# Patient Record
Sex: Female | Born: 1975 | Race: Black or African American | Hispanic: No | Marital: Married | Smoking: Light tobacco smoker
Health system: Southern US, Community
[De-identification: ages and names within clinical notes are randomized; demographics above are authoritative.]

## PROBLEM LIST (undated history)

## (undated) HISTORY — PX: OTHER SURGICAL HISTORY: SHX169

---

## 2005-08-21 ENCOUNTER — Observation Stay: Payer: Self-pay

## 2005-10-10 ENCOUNTER — Inpatient Hospital Stay: Payer: Self-pay

## 2006-09-12 ENCOUNTER — Emergency Department: Payer: Self-pay | Admitting: Unknown Physician Specialty

## 2006-09-12 ENCOUNTER — Other Ambulatory Visit: Payer: Self-pay

## 2007-08-25 ENCOUNTER — Emergency Department: Payer: Self-pay | Admitting: Emergency Medicine

## 2008-03-03 ENCOUNTER — Emergency Department: Payer: Self-pay | Admitting: Emergency Medicine

## 2008-03-14 ENCOUNTER — Encounter: Payer: Self-pay | Admitting: Maternal & Fetal Medicine

## 2008-03-31 ENCOUNTER — Encounter: Payer: Self-pay | Admitting: Obstetrics & Gynecology

## 2009-04-10 ENCOUNTER — Inpatient Hospital Stay (HOSPITAL_COMMUNITY): Admission: AD | Admit: 2009-04-10 | Discharge: 2009-04-13 | Payer: Self-pay | Admitting: Obstetrics and Gynecology

## 2009-04-10 ENCOUNTER — Encounter (INDEPENDENT_AMBULATORY_CARE_PROVIDER_SITE_OTHER): Payer: Self-pay | Admitting: Obstetrics and Gynecology

## 2010-07-05 ENCOUNTER — Emergency Department: Payer: Self-pay | Admitting: Emergency Medicine

## 2010-12-30 LAB — CBC
HCT: 41.4 % (ref 36.0–46.0)
MCHC: 33.6 g/dL (ref 30.0–36.0)
MCV: 99.4 fL (ref 78.0–100.0)
Platelets: 161 10*3/uL (ref 150–400)
Platelets: 204 10*3/uL (ref 150–400)
RDW: 14.2 % (ref 11.5–15.5)
RDW: 14.5 % (ref 11.5–15.5)

## 2010-12-30 LAB — KLEIHAUER-BETKE STAIN
Fetal Cells %: 0 %
Quantitation Fetal Hemoglobin: 0 mL

## 2011-02-05 NOTE — Discharge Summary (Signed)
NAMEZYIONNA, PESCE               ACCOUNT NO.:  [id number]   MEDICAL RECORD NO.:  [id number]          PATIENT TYPE:  INP   LOCATION:  9311                          FACILITY:  [facility]   PHYSICIAN:  Janine Limbo, M.D.DATE OF BIRTH:  August 26, 1976   DATE OF ADMISSION:  04/10/2009  DATE OF DISCHARGE:  04/13/2009                               DISCHARGE SUMMARY   ATTENDING PHYSICIAN:  Janine Limbo, MD   ADMITTING DIAGNOSES:  1. Intrauterine pregnancy at 33-4/7 weeks.  2. Nonreassuring fetal heart rate with abnormal Dopplers.  3. Acute abruption.   POSTOPERATIVE DIAGNOSES:  1. Intrauterine pregnancy at 33-4/7 weeks.  2. Nonreassuring fetal heart rate with abnormal Dopplers.  3. Acute abruption.   PROCEDURES:  Repeat low transverse cesarean section.   HOSPITAL COURSE:  Ms. Goin is a 35 year old gravida 4, para 1-2-0-1,  who presented to the office at 33-4/7 weeks for a scheduled visit.  She  had an ultrasound that day that showed no appreciable amniotic fluid and  a biophysical profile of 0/8.  She was sent directly to Maternity  Admissions Unit to be prepped for a stat C-section.  While she was on  the way to the hospital, she then began to have some bleeding.  Dr.  Estanislado Pandy took the patient to the operating room within 20-25 minutes after  her arrival to the hospital and findings were an acute abruptio in  process.   FINDINGS:  A viable female by the name of Mia, Apgars were 3, 6, and 6,  cord pH was 6.96.  Infant was taken to NICU.  Mother was taken to  recovery in good condition.  The patient's pregnancy had been remarkable  for;  1. A second trimester loss in 1994 at 22 weeks.  2. A previous cesarean section in 1999 at 40 weeks for nonreassuring      fetal heart rate, which led to the birth of a girl named Karel Jarvis, who      was vaguely born in 1 hour.  3. Spontaneous vaginal delivery at 28 weeks of a boy with severe      oligo, who died shortly after birth.  4. Elevated  BMI.  5. Smoker.  6. Positive sickle cell trait.   After surgery, the patient was taken to recovery in good condition.  By  postop day #1, the patient was doing well.  She was up ad lib.  She had  good pain control.  Infant had by that time been extubated in NICU.  The  patient was planning to bottle-feed.  The patient's hemoglobin on day 1  was 11.7, white blood cell count was 9.8, and platelet count was 161.  Pastoral care did visit the patient in light of the NICU infant.  The  patient continued to do well.  She had had a JP drain that drained a  normal amount during her hospitalization.  She was planning to use Ortho-  Tri-Cyclen for birth control.  She had an occasional elevation of her  blood pressure in 126/90, 125/84.  However, no documented issue of  chronic hypertension, gestational  hypertension, or preeclampsia was  noted.  By postop day #3, the patient was doing well.  She was up ad  lib.  Her incision was clean, dry, and intact.  Her JP drain had drained  a small amount and was removed without difficulty.  The patient had  Steri-Strips and subcuticular sutures noted that were intact.  Lochia  was scant.  Infant was continued to improve in the NICU.  There were  some issues with blood sugar management as well as some hematological  issues with one of the components of CBC.  This was followed closely by  Neonatology.  By April 13, 2009, the patient was deemed to receive full  benefit of her hospital stay and was discharged to home in good  condition.   DISCHARGE MEDICATIONS:  1. Motrin 600 mg p.o. q.6 h. p.r.n. pain.  2. Percocet 5/325 one to two p.o. q.2-4 h. p.r.n. pain.  3. Ortho-Tri-Cyclen 1 p.o. daily to be started in 2 weeks.   DISCHARGE FOLLOWUP:  Occur in 4-6 weeks at [facility] or p.r.n.      Renaldo Reel Emilee Hero, C.N.M.      Janine Limbo, M.D.  Electronically Signed    VLL/MEDQ  D:  04/13/2009  T:  04/13/2009  Job:  [id number]

## 2011-02-05 NOTE — H&P (Signed)
[name], [name]               ACCOUNT NO.:  [id number]   MEDICAL RECORD NO.:  [id number]          PATIENT TYPE:  INP   LOCATION:  9311                          [facility]ILITY[facility]  [facility]   PHYSICIAN:  Crist Fat. Rivard, M.D. DATE OF BIRTH:  1976-06-19   DATE OF ADMISSION:  04/10/2009  DATE OF DISCHARGE:                              HISTORY & PHYSICAL   A 35 year old gravida 4, para 3 (one at 22 weeks, died; one at 39 weeks,  deceased) with last menstrual period August 12, 2008, equals an [facility] of  May 21, 2009; ultrasound, May 25, 2009, presents from the office  after a nonstress test and biophysical profile showed a nonreassuring  tracing and 0 fluid.  She had a history of oligohydramnios with her last  child.  She did present to the MAU unit stating that while on her way to  the unit, she started bleeding.  Prenatal care began on October 10, 2008, at [facility].  She was on 17P and her total weight  gain is 4 pounds.  Her labs, O positive with platelets of 294.  VDRL was  nonreactive, rubella immune, HBsAg negative, G negative, HIV  nonreactive, Pap within normal limits, negative GC and CT.   ADMISSION DIAGNOSIS:  Intrauterine pregnancy at 33 weeks with  nonreassuring fetal heart tracing, biophysical profile was 0 and a  nonstress test that was nonreactive.  She had late decelerations and  bleeding.   PAST OB HISTORY:  In 02-18-1993 at 21/22 weeks, she had a vaginal delivery of  a female infant that died.  In 1998-02-18, spontaneous vaginal delivery of a  viable female infant of 40 weeks.  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  In 2006/02/18, at 28 weeks, she had a  history of oligohydramnios with that pregnancy and the baby died.   PAST MEDICAL HISTORY:  She is allergic to MORPHINE, which causes rash.  Denies diabetes, epilepsy, or heart disease.   PAST SURGICAL HISTORY:  She had a C-section for breech.   FAMILY HISTORY:  Heart disease in her mother  and her maternal uncle.  Her maternal grandfather had COPD.  Her mother lupus, cancer.  Stomach  cancer in her paternal grandfather.  Maternal grandfather had prostate  cancer.  Diabetes, her maternal grandmother and paternal grandmother,  adult onset.  Twins, her nephews are twins.   SOCIAL HISTORY:  She is married to [name].  They are both high  school graduates.  She is a smoker, but she stated she decreased this  pregnancy.  She did have alcohol on New Year's Eve, but she has not  drank alcohol.   VITAL SIGNS:  The patient is alert to time and place.  HEART:  Regular without murmur.  LUNGS:  Clear bilaterally.  ABDOMEN:  Trocar placed.  No contractions noted but palpated mild-to-  moderate.  GENITOURINARY:  Vaginal exam, no active bleeding noted, deferred.  There  was some bleeding on underpants, nurse said it was moderate, bright red.   Fetal monitoring placed no variability, questionable late decelerations  were noted.   ASSESSMENT:  Intrauterine pregnancy at 33 weeks with a nonreassuring  fetal heart rate, nonreassuring biophysical profile but nonstress test  and oligohydramnios.   PLAN:  1. Admit to [facility], Dr. Estanislado Pandy is present.  2. Preparing for C-section, shave and IV, OR notified and preparing to      stand by for C-section to the OR.   The baby was 3 pounds, her name is [name], Apgars 3 at one minute, 6 at  five, and 6 at ten.  She had a repeat C-section.  Cord pH was 6.96 and  placenta was sent to the lab.      Jasmine Awe, CNM      Dois Davenport A. Rivard, M.D.  Electronically Signed    JM/MEDQ  D:  04/10/2009  T:  04/11/2009  Job:  [id number]

## 2011-02-05 NOTE — Op Note (Signed)
NAMESOLACE, MANWARREN               ACCOUNT NO.:  [id number]   MEDICAL RECORD NO.:  [id number]          PATIENT TYPE:  INP   LOCATION:  9311                          [facility]   PHYSICIAN:  Crist Fat. Rivard, M.D. DATE OF BIRTH:  1976/04/14   DATE OF PROCEDURE:  04/10/2009  DATE OF DISCHARGE:                               OPERATIVE REPORT   PREOPERATIVE DIAGNOSES:  Intrauterine pregnancy at 33 weeks and 4 days  with nonreassuring fetal testing with a biophysical profile of 0/10,  amniotic fluid index of 0, and reversed diastolic flow on Doppler study,  as well as acute placental abruptio.   POSTOPERATIVE DIAGNOSIS:  Intrauterine pregnancy at 33 weeks and 4 days  with nonreassuring fetal testing with a biophysical profile of 0/10,  amniotic fluid index of 0, and reversed diastolic flow on Doppler study,  as well as acute placental abruptio.   ANESTHESIA:  Spinal.   ANESTHESIOLOGIST:  Raul Del, MD   PROCEDURE:  Repeat low-transverse cesarean section.   SURGEON:  Crist Fat. Rivard, MD   ASSISTANT:  Jasmine Awe, CNM.   ESTIMATED BLOOD LOSS:  700 mL.   HISTORY OF PRESENT ILLNESS:  This is a 35 year old married African  American female, gravida 4, para 3 who presented to the office today for  routine fetal testing due to previous poor obstetrical history.  Her  ultrasound at 33 weeks and 4 days revealed a biophysical profile of  0/10, absence of any measurable amniotic fluid pocket, and reverse end-  diastolic flow on Doppler study.  She was seen by Nigel Bridgeman in the  office and sent directly to Maternity Admission to undergo an emergent  repeat cesarean section.   Her obstetrical history is remarkable for a second trimester loss in  1994 at 79 weeks of a female infant, a cesarean section in 1999 at 40  weeks for nonreassuring fetal heart rate which led to the birth of a  little girl named Karel Jarvis who is legally blind in one eye.  In 2007, she  had a spontaneous  vaginal delivery at 28 weeks of a little boy with  severe oligohydramnios who died shortly after birth.   This pregnancy was followed with twice weekly monitoring, as well as 17-  hydroxyprogesterone injection weekly to avoid preterm labor.  Her last  testing in the office was on July 15 with an NST that was reactive.  The  patient came in this morning with no complaints, but as soon as she left  the office, she started bleeding bright red blood and upon arrival at  [facility] Admission had a moderate amount of vaginal bleeding.  She was  rapidly prepared for cesarean section, rapidly consented, and tracing of  the fetal heart rate revealed a flat tracing with late deceleration.  Between the time, she was in the room at Maternity Admission at 10:22  and the time of birth of the baby at 10:47, there is 25 minutes  including preparation consent, anesthesia and surgery.   PROCEDURE IN DETAIL:  After brief consent and review of risks, the  patient was taken  to cesarean suite and given spinal anesthesia by Dr.  Tacy Dura without any complication.  She was prepped and draped in a  sterile fashion and a Foley catheter was inserted in her bladder.  After  assessing adequate level of anesthesia, we proceeded with a Pfannenstiel  incision overlooking the previous incision and this was brought down  sharply to the fascia.  The fascia was incised in a low-transverse  fashion.  Linea alba was dissected.  Peritoneum was entered bluntly.  Visceral peritoneum was entered in a low-transverse fashion allowing [place]  to safely retract bladder by developing a bladder flap.  Myometrium was  entered in a low transverse fashion first with knife then extended  bluntly.   We assisted the birth of a female infant in vertex presentation at 10:47  a.m.  Mouth and nose were suctioned.  Baby was delivered.  Cord was  clamped with 2 Kelly clamps and sectioned, and the baby was given to the  neonatologist present in the  room.  At this time, we noted multiple  clots coming out from the uterine cavity compatible with our previous  suspicion of acute placental abruption, 2 mL was drawn from the  umbilical artery to send for cord pH, 10 mL was drawn from the umbilical  vein at its insertion with the placenta due to collapse elsewhere and  the placenta was delivered spontaneously.  It was complete, uterine  revision was negative.  Cord had three vessels.  Placenta and cord were  sent to Pathology.   We then proceeded with closure of the myometrium in 2 layers, first with  a running lock suture of 0 Vicryl, then with a Lembert suture of 0  Vicryl imbricating the first one.  Hemostasis was completed on the  peritoneum with cauterization.  Hemostasis was rechecked and deemed  adequate.   Both paracolic gutters were cleaned.  Both tubes and ovaries assessed  and normal except for fine adhesions on the right tube which were  sharply dissected.  Hemostasis in the pelvis was then rechecked and  deemed adequate.  We profusely irrigated the pelvis with warm saline and  removed our retractors and our sponges.  Under fascia, hemostasis was  completed with cautery and the fascia was closed with 2 running sutures  of 1 Vicryl meeting midline.  The wound was irrigated with warm saline.  Hemostasis was completed with cautery and a #10 JP drain was left in the  incision with a left counterincision and sutured to the skin with 0  silk.  The skin was then closed with a subcuticular suture of 3-0  Monocryl and Steri-Strips.   Instruments and sponge count were complete x2.  Estimated blood loss was  700 mL.  The procedure is well tolerated by the patient who was taken to  recovery room in a well and stable condition.   Little girl named [name] was born at 10:47 a.m., received an Apgar of 3 at  1 minute, 6 at 5 minutes, 6 at 10 minutes and had a cord pH of 6.96, was  taken to the NICU, intubated.      Crist Fat Rivard,  M.D.  Electronically Signed     SAR/MEDQ  D:  04/10/2009  T:  04/11/2009  Job:  [id number]

## 2011-08-04 ENCOUNTER — Emergency Department: Payer: Self-pay | Admitting: Unknown Physician Specialty

## 2011-08-06 ENCOUNTER — Emergency Department: Payer: Self-pay | Admitting: Emergency Medicine

## 2013-07-17 ENCOUNTER — Emergency Department: Payer: Self-pay | Admitting: Emergency Medicine

## 2014-10-28 ENCOUNTER — Emergency Department: Payer: Self-pay | Admitting: Emergency Medicine

## 2014-10-28 LAB — COMPREHENSIVE METABOLIC PANEL
ALBUMIN: 3.6 g/dL (ref 3.4–5.0)
ALK PHOS: 84 U/L (ref 46–116)
Anion Gap: 5 — ABNORMAL LOW (ref 7–16)
BUN: 9 mg/dL (ref 7–18)
Bilirubin,Total: 0.3 mg/dL (ref 0.2–1.0)
CALCIUM: 8.6 mg/dL (ref 8.5–10.1)
CREATININE: 1.08 mg/dL (ref 0.60–1.30)
Chloride: 109 mmol/L — ABNORMAL HIGH (ref 98–107)
Co2: 27 mmol/L (ref 21–32)
EGFR (African American): >60
GLUCOSE: 111 mg/dL — AB (ref 65–99)
Osmolality: 281 (ref 275–301)
POTASSIUM: 4 mmol/L (ref 3.5–5.1)
SGOT(AST): 13 U/L — ABNORMAL LOW (ref 15–37)
SGPT (ALT): 17 U/L (ref 14–63)
SODIUM: 141 mmol/L (ref 136–145)
TOTAL PROTEIN: 8 g/dL (ref 6.4–8.2)

## 2014-10-28 LAB — URINALYSIS, COMPLETE
BILIRUBIN, UR: NEGATIVE
Bacteria: NONE SEEN
GLUCOSE, UR: NEGATIVE mg/dL (ref 0–75)
Ketone: NEGATIVE
LEUKOCYTE ESTERASE: NEGATIVE
Nitrite: NEGATIVE
PH: 6 (ref 4.5–8.0)
Protein: NEGATIVE
Specific Gravity: 1.012 (ref 1.003–1.030)
WBC UR: 2 /HPF (ref 0–5)

## 2014-10-28 LAB — CBC WITH DIFFERENTIAL/PLATELET
BASOS ABS: 0.1 10*3/uL (ref 0.0–0.1)
Basophil %: 0.8 %
EOS PCT: 3.4 %
Eosinophil #: 0.2 10*3/uL (ref 0.0–0.7)
HCT: 39.8 % (ref 35.0–47.0)
HGB: 13.2 g/dL (ref 12.0–16.0)
Lymphocyte #: 2.9 10*3/uL (ref 1.0–3.6)
Lymphocyte %: 45.9 %
MCH: 31.1 pg (ref 26.0–34.0)
MCHC: 33.2 g/dL (ref 32.0–36.0)
MCV: 94 fL (ref 80–100)
Monocyte #: 0.4 x10 3/mm (ref 0.2–0.9)
Monocyte %: 6.6 %
NEUTROS PCT: 43.3 %
Neutrophil #: 2.7 10*3/uL (ref 1.4–6.5)
PLATELETS: 277 10*3/uL (ref 150–440)
RBC: 4.25 10*6/uL (ref 3.80–5.20)
RDW: 12.6 % (ref 11.5–14.5)
WBC: 6.3 10*3/uL (ref 3.6–11.0)

## 2014-10-28 LAB — AMYLASE: AMYLASE: 69 U/L (ref 25–115)

## 2014-10-28 LAB — LIPASE, BLOOD: LIPASE: 138 U/L (ref 73–393)

## 2017-04-14 ENCOUNTER — Ambulatory Visit (INDEPENDENT_AMBULATORY_CARE_PROVIDER_SITE_OTHER): Payer: BLUE CROSS/BLUE SHIELD | Admitting: Family

## 2017-04-14 ENCOUNTER — Encounter: Payer: Self-pay | Admitting: Family

## 2017-04-14 VITALS — BP 136/84 | HR 71 | Temp 98.4°F | Ht 64.0 in | Wt 256.0 lb

## 2017-04-14 DIAGNOSIS — G8929 Other chronic pain: Secondary | ICD-10-CM

## 2017-04-14 DIAGNOSIS — Z800 Family history of malignant neoplasm of digestive organs: Secondary | ICD-10-CM | POA: Diagnosis not present

## 2017-04-14 DIAGNOSIS — Z7689 Persons encountering health services in other specified circumstances: Secondary | ICD-10-CM

## 2017-04-14 DIAGNOSIS — M5440 Lumbago with sciatica, unspecified side: Secondary | ICD-10-CM

## 2017-04-14 LAB — COMPREHENSIVE METABOLIC PANEL
ALK PHOS: 69 U/L (ref 39–117)
ALT: 12 U/L (ref 0–35)
AST: 15 U/L (ref 0–37)
Albumin: 3.9 g/dL (ref 3.5–5.2)
BILIRUBIN TOTAL: 0.3 mg/dL (ref 0.2–1.2)
BUN: 10 mg/dL (ref 6–23)
CALCIUM: 9.2 mg/dL (ref 8.4–10.5)
CO2: 21 mEq/L (ref 19–32)
Chloride: 108 mEq/L (ref 96–112)
Creatinine, Ser: 0.99 mg/dL (ref 0.40–1.20)
GFR: 79.11 mL/min (ref >60.00)
Glucose, Bld: 88 mg/dL (ref 70–99)
POTASSIUM: 4.1 meq/L (ref 3.5–5.1)
Sodium: 137 mEq/L (ref 135–145)
TOTAL PROTEIN: 7.6 g/dL (ref 6.0–8.3)

## 2017-04-14 LAB — LIPID PANEL
Cholesterol: 132 mg/dL (ref 0–200)
HDL: 37.6 mg/dL — ABNORMAL LOW (ref >39.00)
LDL Cholesterol: 80 mg/dL (ref 0–99)
NonHDL: 94.31
TRIGLYCERIDES: 70 mg/dL (ref 0.0–149.0)
Total CHOL/HDL Ratio: 4
VLDL: 14 mg/dL (ref 0.0–40.0)

## 2017-04-14 LAB — CBC WITH DIFFERENTIAL/PLATELET
Basophils Absolute: 0 10*3/uL (ref 0.0–0.1)
Basophils Relative: 0.2 % (ref 0.0–3.0)
EOS PCT: 1.9 % (ref 0.0–5.0)
Eosinophils Absolute: 0.1 10*3/uL (ref 0.0–0.7)
HEMATOCRIT: 40.7 % (ref 36.0–46.0)
Hemoglobin: 13.5 g/dL (ref 12.0–15.0)
LYMPHS ABS: 2.7 10*3/uL (ref 0.7–4.0)
LYMPHS PCT: 38.5 % (ref 12.0–46.0)
MCHC: 33.1 g/dL (ref 30.0–36.0)
MCV: 93.6 fl (ref 78.0–100.0)
MONOS PCT: 8.2 % (ref 3.0–12.0)
Monocytes Absolute: 0.6 10*3/uL (ref 0.1–1.0)
NEUTROS PCT: 51.2 % (ref 43.0–77.0)
Neutro Abs: 3.6 10*3/uL (ref 1.4–7.7)
Platelets: 283 10*3/uL (ref 150.0–400.0)
RBC: 4.35 Mil/uL (ref 3.87–5.11)
RDW: 12.6 % (ref 11.5–15.5)
WBC: 7 10*3/uL (ref 4.0–10.5)

## 2017-04-14 LAB — TSH: TSH: 3.3 u[IU]/mL (ref 0.35–4.50)

## 2017-04-14 LAB — HIV ANTIBODY (ROUTINE TESTING W REFLEX): HIV: NONREACTIVE

## 2017-04-14 LAB — HEMOGLOBIN A1C: HEMOGLOBIN A1C: 5.2 % (ref 4.6–6.5)

## 2017-04-14 LAB — VITAMIN D 25 HYDROXY (VIT D DEFICIENCY, FRACTURES): VITD: 16.2 ng/mL — AB (ref 30.00–100.00)

## 2017-04-14 MED ORDER — CYCLOBENZAPRINE HCL 5 MG PO TABS: 5.0000 mg | ORAL_TABLET | Freq: Every day | ORAL | 1 refills | Status: DC

## 2017-04-14 NOTE — Assessment & Plan Note (Addendum)
Screening labs including HIV consented for. Patient understands to schedule 3-D mammogram. Patient will return for physical

## 2017-04-14 NOTE — Assessment & Plan Note (Signed)
Acute on chronic. We had a discussion and suspect low back pain is multifactorial, one of which including patient's work, weight, large breasts. At this time, we decided patient  needed something on occasion after a long day for low back pain. She will try muscle relaxant. She will let me know if not better. We also discussed physical therapy, breast reduction consult in the future

## 2017-04-14 NOTE — Progress Notes (Signed)
Subjective:    Patient ID: [name], female    DOB: 24-Nov-1975, 41 y.o.   MRN: [id number]  CC: [name] is a 41 y.o. female who presents today to establish care.    HPI: Low back pain x 4 months, unchanged. Intermittent. Slipped at work.  Tried tyelonol, heat with relief. NO numbness in leg from thigh to left foot.  No urinary incontinence.   Didn't hit head, LOC.   Family h/o colonoscopy ( father, sister)  Pap 07/2016 with [name] - normal per patient.   Walking twice per week. Had taken phentermine years ago, has maintained weight loss. Recently  Gave up sodas.       HISTORY:  No past medical history on file. No past surgical history on file. No family history on file.  Allergies: Morphine No current outpatient prescriptions on file prior to visit.   No current facility-administered medications on file prior to visit.     Social History  Substance Use Topics  . Smoking status: Former Research scientist (life sciences)  . Smokeless tobacco: Never Used  . Alcohol use Yes    Review of Systems  Constitutional: Negative for chills and fever.  Respiratory: Negative for cough.   Cardiovascular: Negative for chest pain and palpitations.  Gastrointestinal: Negative for nausea and vomiting.  Musculoskeletal: Positive for back pain.  Neurological: Positive for numbness.      Objective:    BP 136/84   Pulse 71   Temp 98.4 F (36.9 C) (Oral)   Ht 5\' 4"  (1.626 m)   Wt 256 lb (116.1 kg)   SpO2 100%   BMI 43.94 kg/m  BP Readings from Last 3 Encounters:  04/14/17 136/84   Wt Readings from Last 3 Encounters:  04/14/17 256 lb (116.1 kg)    Physical Exam  Constitutional: She appears well-developed and well-nourished.  Eyes: Conjunctivae are normal.  Cardiovascular: Normal rate, regular rhythm, normal heart sounds and normal pulses.   Pulmonary/Chest: Effort normal and breath sounds normal. She has no wheezes. She has no rhonchi. She has no rales.  Musculoskeletal:     Lumbar back: She exhibits normal range of motion, no tenderness, no bony tenderness, no swelling, no edema, no pain and no spasm.       Back:  Area of pain as described by patient noted on diagram.   Full range of motion with flexion, tension, lateral side bends. No bony tenderness. No pain, numbness, tingling elicited with single leg raise bilaterally.   Neurological: She is alert. She has normal strength. No sensory deficit.  Reflex Scores:      Patellar reflexes are 2+ on the right side and 2+ on the left side. Sensation and strength intact bilateral lower extremities.  Skin: Skin is warm and dry.  Psychiatric: She has a normal mood and affect. Her speech is normal and behavior is normal. Thought content normal.  Vitals reviewed.      Assessment & Plan:   Problem List Items Addressed This Visit      Nervous and Auditory   Chronic left-sided low back pain with sciatica - Primary    Acute on chronic. We had a discussion and suspect low back pain is multifactorial, one of which including patient's work, weight, large breasts. At this time, we decided patient  needed something on occasion after a long day for low back pain. She will try muscle relaxant. She will let me know if not better. We also discussed physical therapy, breast reduction consult  in the future      Relevant Medications   Acetaminophen (TYLENOL 8 HOUR PO)   cyclobenzaprine (FLEXERIL) 5 MG tablet     Other   Family history of colon cancer in father    Ordered.      Relevant Orders   Ambulatory referral to Gastroenterology   Encounter to establish care    Screening labs including HIV consented for. Patient understands to schedule 3-D mammogram. Patient will return for physical      Relevant Orders   MM SCREENING BREAST TOMO BILATERAL   Ambulatory referral to Gastroenterology   CBC with Differential/Platelet   Comprehensive metabolic panel   Hemoglobin A1c   HIV antibody   Lipid panel   TSH   VITAMIN  D 25 Hydroxy (Vit-D Deficiency, Fractures)       I am having Ms. Reber start on cyclobenzaprine. I am also having her maintain her MULTI-VITAMINS and Acetaminophen (TYLENOL 8 HOUR PO).   Meds ordered this encounter  Medications  . Multiple Vitamin (MULTI-VITAMINS) TABS    Sig: Take by mouth.  . Acetaminophen (TYLENOL 8 HOUR PO)    Sig: Take by mouth.  . cyclobenzaprine (FLEXERIL) 5 MG tablet    Sig: Take 1 tablet (5 mg total) by mouth at bedtime. Take one to tablets at bedtime for muscle spasm,  Low back pain.    Dispense:  30 tablet    Refill:  1    Order Specific Question:   Supervising Provider    Answer:   Crecencio Mc [[id number]]    Return precautions given.   Risks, benefits, and alternatives of the medications and treatment plan prescribed today were discussed, and patient expressed understanding.   Education regarding symptom management and diagnosis given to patient on AVS.  Continue to follow with No primary care provider on file. for routine health maintenance.   [name] and I agreed with plan.   Mable Paris, FNP

## 2017-04-14 NOTE — Progress Notes (Signed)
Pre visit review using our clinic review tool, if applicable. No additional management support is needed unless otherwise documented below in the visit note. 

## 2017-04-14 NOTE — Assessment & Plan Note (Signed)
Ordered

## 2017-04-14 NOTE — Patient Instructions (Addendum)
Labs   Trial flexeril for low back pain; start with one tablet ( 5mg ) at bedtime  Do not drive or operate heavy machinery while on muscle relaxant. Please do not drink alcohol. Only take this medication as needed for acute muscle spasm at bedtime. This medication make you feel drowsy so be very careful.  Stop taking if become too drowsy or somnolent as this puts you at risk for falls. Please contact our office with any questions.    placed a referral. Mammogram this year. I asked that you call one the below locations and schedule this when it is convenient for you.   If you have dense breasts, you may ask for 3D mammogram over the traditional 2D mammogram as new evidence suggest 3D is superior. Please note that NOT all insurance companies cover 3D and you may have to pay a higher copay. You may call your insurance company to further clarify your benefits.   Options for [place]  [place], [place]  * [facility] [place] [place] * Note if you ask for 3D mammogram at this location, you must request [place], [place] location*

## 2017-04-15 ENCOUNTER — Telehealth: Payer: Self-pay

## 2017-04-15 ENCOUNTER — Other Ambulatory Visit: Payer: Self-pay

## 2017-04-15 DIAGNOSIS — Z1211 Encounter for screening for malignant neoplasm of colon: Secondary | ICD-10-CM

## 2017-04-15 DIAGNOSIS — Z8371 Family history of colonic polyps: Secondary | ICD-10-CM

## 2017-04-15 DIAGNOSIS — Z800 Family history of malignant neoplasm of digestive organs: Secondary | ICD-10-CM

## 2017-04-15 NOTE — Telephone Encounter (Signed)
Gastroenterology Pre-Procedure Review  Request Date: 05/30/17 Requesting Physician: Dr. Vicente Males  PATIENT REVIEW QUESTIONS: The patient responded to the following health history questions as indicated:    1. Are you having any GI issues? no 2. Do you have a personal history of Polyps? no 3. Do you have a family history of Colon Cancer or Polyps? yes (Father and brother) 4. Diabetes Mellitus? no 5. Joint replacements in the past 12 months?no 6. Major health problems in the past 3 months?no 7. Any artificial heart valves, MVP, or defibrillator?no    MEDICATIONS & ALLERGIES:    Patient reports the following regarding taking any anticoagulation/antiplatelet therapy:   Plavix, Coumadin, Eliquis, Xarelto, Lovenox, Pradaxa, Brilinta, or Effient? no Aspirin? no  Patient confirms/reports the following medications:  Current Outpatient Prescriptions  Medication Sig Dispense Refill  . Acetaminophen (TYLENOL 8 HOUR PO) Take by mouth.    . cyclobenzaprine (FLEXERIL) 5 MG tablet Take 1 tablet (5 mg total) by mouth at bedtime. Take one to tablets at bedtime for muscle spasm,  Low back pain. 30 tablet 1  . Multiple Vitamin (MULTI-VITAMINS) TABS Take by mouth.     No current facility-administered medications for this visit.     Patient confirms/reports the following allergies:  Allergies  Allergen Reactions  . Morphine     No orders of the defined types were placed in this encounter.   AUTHORIZATION INFORMATION Primary Insurance: 1D#: Group #:  Secondary Insurance: 1D#: Group #:  SCHEDULE INFORMATION: Date: 05/30/17 Time: Location:[facility]

## 2017-05-14 ENCOUNTER — Ambulatory Visit
Admission: RE | Admit: 2017-05-14 | Discharge: 2017-05-14 | Disposition: A | Discharge status: Home / Self Care | Payer: BLUE CROSS/BLUE SHIELD | Source: Ambulatory Visit | Attending: Family | Admitting: Family

## 2017-05-14 DIAGNOSIS — Z7689 Persons encountering health services in other specified circumstances: Secondary | ICD-10-CM | POA: Insufficient documentation

## 2017-05-14 DIAGNOSIS — Z1231 Encounter for screening mammogram for malignant neoplasm of breast: Secondary | ICD-10-CM | POA: Insufficient documentation

## 2017-05-16 ENCOUNTER — Inpatient Hospital Stay
Admission: RE | Admit: 2017-05-16 | Discharge: 2017-05-16 | Disposition: A | Discharge status: Home / Self Care | Payer: Self-pay | Source: Ambulatory Visit | Attending: *Deleted | Admitting: *Deleted

## 2017-05-16 ENCOUNTER — Other Ambulatory Visit: Payer: Self-pay | Admitting: *Deleted

## 2017-05-16 DIAGNOSIS — Z9289 Personal history of other medical treatment: Secondary | ICD-10-CM

## 2017-05-23 ENCOUNTER — Telehealth: Payer: Self-pay | Admitting: Family

## 2017-05-23 NOTE — Telephone Encounter (Signed)
PT dropped off a bill she got from her labs. Her insurance stated this code that was used is a un-billable code. Please check code and resubmit. Please call pt .

## 2017-05-27 NOTE — Telephone Encounter (Signed)
Not sure what to do please advise. Will await for your return.

## 2017-05-28 ENCOUNTER — Telehealth: Payer: Self-pay | Admitting: *Deleted

## 2017-05-28 NOTE — Telephone Encounter (Signed)
Pt stated that [facility] on [address] faxed over a requested for her bowel prep script for her colostomy  Pt contact [phone]

## 2017-05-28 NOTE — Telephone Encounter (Signed)
We have yet to receive fax. Will await til tomorrow.

## 2017-05-29 NOTE — Telephone Encounter (Signed)
Pt stated that [facility] stated that this form was faxed over -pt was informed of previous statement  Pt will call [facility] to have them re-fax

## 2017-05-30 ENCOUNTER — Encounter
Admission: RE | Disposition: A | Discharge status: Home / Self Care | Payer: Self-pay | Source: Ambulatory Visit | Attending: Gastroenterology

## 2017-05-30 ENCOUNTER — Encounter: Payer: Self-pay | Admitting: *Deleted

## 2017-05-30 ENCOUNTER — Ambulatory Visit
Admission: RE | Admit: 2017-05-30 | Discharge: 2017-05-30 | Disposition: A | Discharge status: Home / Self Care | Payer: BLUE CROSS/BLUE SHIELD | Source: Ambulatory Visit | Attending: Gastroenterology | Admitting: Gastroenterology

## 2017-05-30 ENCOUNTER — Ambulatory Visit: Payer: BLUE CROSS/BLUE SHIELD | Admitting: Anesthesiology

## 2017-05-30 DIAGNOSIS — D125 Benign neoplasm of sigmoid colon: Secondary | ICD-10-CM | POA: Diagnosis not present

## 2017-05-30 DIAGNOSIS — D123 Benign neoplasm of transverse colon: Secondary | ICD-10-CM | POA: Diagnosis not present

## 2017-05-30 DIAGNOSIS — Z720 Tobacco use: Secondary | ICD-10-CM | POA: Insufficient documentation

## 2017-05-30 DIAGNOSIS — Z79899 Other long term (current) drug therapy: Secondary | ICD-10-CM | POA: Diagnosis not present

## 2017-05-30 DIAGNOSIS — Z800 Family history of malignant neoplasm of digestive organs: Secondary | ICD-10-CM | POA: Diagnosis not present

## 2017-05-30 DIAGNOSIS — Z1211 Encounter for screening for malignant neoplasm of colon: Secondary | ICD-10-CM | POA: Insufficient documentation

## 2017-05-30 DIAGNOSIS — Z8371 Family history of colonic polyps: Secondary | ICD-10-CM

## 2017-05-30 HISTORY — PX: COLONOSCOPY WITH PROPOFOL: SHX5780

## 2017-05-30 LAB — POCT PREGNANCY, URINE: PREG TEST UR: NEGATIVE

## 2017-05-30 SURGERY — COLONOSCOPY WITH PROPOFOL
Anesthesia: General

## 2017-05-30 MED ORDER — MIDAZOLAM HCL 2 MG/2ML IJ SOLN
INTRAMUSCULAR | Status: AC
  Filled 2017-05-30: qty 2

## 2017-05-30 MED ORDER — PROPOFOL 500 MG/50ML IV EMUL
INTRAVENOUS | Status: DC | PRN
  Administered 2017-05-30: 140 ug/kg/min via INTRAVENOUS

## 2017-05-30 MED ORDER — SODIUM CHLORIDE 0.9 % IV SOLN
INTRAVENOUS | Status: DC
  Administered 2017-05-30: 09:00:00 via INTRAVENOUS

## 2017-05-30 MED ORDER — LIDOCAINE HCL (CARDIAC) 20 MG/ML IV SOLN
INTRAVENOUS | Status: DC | PRN
  Administered 2017-05-30: 40 mg via INTRAVENOUS

## 2017-05-30 MED ORDER — MIDAZOLAM HCL 2 MG/2ML IJ SOLN
INTRAMUSCULAR | Status: DC | PRN
  Administered 2017-05-30: 1 mg via INTRAVENOUS

## 2017-05-30 MED ORDER — PROPOFOL 10 MG/ML IV BOLUS
INTRAVENOUS | Status: DC | PRN
  Administered 2017-05-30: 70 mg via INTRAVENOUS

## 2017-05-30 NOTE — Transfer of Care (Signed)
Immediate Anesthesia Transfer of Care Note  Patient: [name]  Procedure(s) Performed: Procedure(s): COLONOSCOPY WITH PROPOFOL (N/A)  Patient Location: PACU  Anesthesia Type:General  Level of Consciousness: awake, alert  and oriented  Airway & Oxygen Therapy: Patient Spontanous Breathing and Patient connected to nasal cannula oxygen  Post-op Assessment: Report given to RN and Post -op Vital signs reviewed and stable  Post vital signs: Reviewed and stable  Last Vitals:  Vitals:   05/30/17 0819 05/30/17 0926  BP: 121/84 (!) 92/46  Pulse: 80 83  Resp: 20 (!) 27  Temp: 36.4 C (!) 35.8 C  SpO2: 100% 100%    Last Pain:  Vitals:   05/30/17 0926  TempSrc: Tympanic         Complications: No apparent anesthesia complications

## 2017-05-30 NOTE — Anesthesia Preprocedure Evaluation (Signed)
Anesthesia Evaluation  Patient identified by MRN, date of birth, ID band Patient awake    Reviewed: Allergy & Precautions, H&P , NPO status , Patient's Chart, lab work & pertinent test results  History of Anesthesia Complications Negative for: history of anesthetic complications  Airway Mallampati: III  TM Distance: >3 FB Neck ROM: full    Dental  (+) Poor Dentition   Pulmonary neg shortness of breath, Current Smoker,           Cardiovascular Exercise Tolerance: Good (-) angina(-) Past MI and (-) DOE negative cardio ROS       Neuro/Psych  Neuromuscular disease negative psych ROS   GI/Hepatic negative GI ROS, Neg liver ROS,   Endo/Other  negative endocrine ROS  Renal/GU negative Renal ROS  negative genitourinary   Musculoskeletal   Abdominal   Peds  Hematology negative hematology ROS (+)   Anesthesia Other Findings Signs and symptoms suggestive of sleep apnea   Past Surgical History: No date: c-sections x 2  BMI    Body Mass Index:  43.93 kg/m      Reproductive/Obstetrics negative OB ROS                             Anesthesia Physical Anesthesia Plan  ASA: III  Anesthesia Plan: General   Post-op Pain Management:    Induction: Intravenous  PONV Risk Score and Plan: Propofol infusion  Airway Management Planned: Natural Airway and Nasal Cannula  Additional Equipment:   Intra-op Plan:   Post-operative Plan:   Informed Consent: I have reviewed the patients History and Physical, chart, labs and discussed the procedure including the risks, benefits and alternatives for the proposed anesthesia with the patient or authorized representative who has indicated his/her understanding and acceptance.   Dental Advisory Given  Plan Discussed with: Anesthesiologist, CRNA and Surgeon  Anesthesia Plan Comments: (Patient consented for risks of anesthesia including but not limited to:   - adverse reactions to medications - risk of intubation if required - damage to teeth, lips or other oral mucosa - sore throat or hoarseness - Damage to heart, brain, lungs or loss of life  Patient voiced understanding.)        Anesthesia Quick Evaluation

## 2017-05-30 NOTE — Op Note (Signed)
[facility] Gastroenterology Patient Name: [name] Procedure Date: 05/30/2017 8:58 AM MRN: [id number] Account #: [id number] Date of Birth: 1975-12-01 Admit Type: Outpatient Age: 41 Room: [facility] ENDO ROOM 3 Gender: Female Note Status: Finalized Procedure:            Colonoscopy Indications:          Colon cancer screening in patient at increased risk:                        Family history of 1st-degree relative with colon polyps Providers:            Jonathon Bellows MD, MD Referring MD:         Yvetta Coder. Arnett (Referring MD) Medicines:            Monitored Anesthesia Care Complications:        No immediate complications. Procedure:            Pre-Anesthesia Assessment:                       - Prior to the procedure, a History and Physical was                        performed, and patient medications, allergies and                        sensitivities were reviewed. The patient's tolerance of                        previous anesthesia was reviewed.                       - The risks and benefits of the procedure and the                        sedation options and risks were discussed with the                        patient. All questions were answered and informed                        consent was obtained.                       - ASA Grade Assessment: II - A patient with mild                        systemic disease.                       After obtaining informed consent, the colonoscope was                        passed under direct vision. Throughout the procedure,                        the patient's blood pressure, pulse, and oxygen                        saturations were monitored continuously. The  Colonoscope was introduced through the anus and                        advanced to the the cecum, identified by the                        appendiceal orifice, IC valve and transillumination.                        The colonoscopy was  performed with ease. The patient                        tolerated the procedure well. The quality of the bowel                        preparation was adequate. Findings:      The perianal and digital rectal examinations were normal.      A 3 mm polyp was found in the hepatic flexure. The polyp was sessile.       The polyp was removed with a cold biopsy forceps. Resection and       retrieval were complete.      A 10 mm polyp was found in the sigmoid colon. The polyp was       semi-pedunculated. The polyp was removed with a hot snare. Resection and       retrieval were complete. To prevent bleeding after the polypectomy, one       hemostatic clip was successfully placed. There was no bleeding at the       end of the procedure.      The exam was otherwise without abnormality on direct and retroflexion       views. Impression:           - One 3 mm polyp at the hepatic flexure, removed with a                        cold biopsy forceps. Resected and retrieved.                       - One 10 mm polyp in the sigmoid colon, removed with a                        hot snare. Resected and retrieved. Clip was placed.                       - The examination was otherwise normal on direct and                        retroflexion views. Recommendation:       - Discharge patient to home (with escort).                       - Resume previous diet.                       - Continue present medications.                       - Await pathology results.                       -  Repeat colonoscopy for surveillance based on                        pathology results. Procedure Code(s):    --- Professional ---                       [phone], Colonoscopy, flexible; with removal of tumor(s),                        polyp(s), or other lesion(s) by snare technique                       45380, 91, Colonoscopy, flexible; with biopsy, single                        or multiple Diagnosis Code(s):    --- Professional ---                        D12.3, Benign neoplasm of transverse colon (hepatic                        flexure or splenic flexure)                       Z83.71, Family history of colonic polyps                       D12.5, Benign neoplasm of sigmoid colon CPT copyright 2016 American Medical Association. All rights reserved. The codes documented in this report are preliminary and upon coder review may  be revised to meet current compliance requirements. Jonathon Bellows, MD Jonathon Bellows MD, MD 05/30/2017 9:27:05 AM This report has been signed electronically. Number of Addenda: 0 Note Initiated On: 05/30/2017 8:58 AM Scope Withdrawal Time: 0 hours 17 minutes 28 seconds  Total Procedure Duration: 0 hours 20 minutes 16 seconds       [facility]

## 2017-05-30 NOTE — Anesthesia Post-op Follow-up Note (Signed)
Anesthesia QCDR form completed.        

## 2017-05-30 NOTE — Anesthesia Postprocedure Evaluation (Signed)
Anesthesia Post Note  Patient: Mickel Crow  Procedure(s) Performed: Procedure(s) (LRB): COLONOSCOPY WITH PROPOFOL (N/A)  Patient location during evaluation: Endoscopy Anesthesia Type: General Level of consciousness: awake and alert Pain management: pain level controlled Vital Signs Assessment: post-procedure vital signs reviewed and stable Respiratory status: spontaneous breathing, nonlabored ventilation, respiratory function stable and patient connected to nasal cannula oxygen Cardiovascular status: blood pressure returned to baseline and stable Postop Assessment: no signs of nausea or vomiting Anesthetic complications: no     Last Vitals:  Vitals:   05/30/17 0945 05/30/17 0955  BP: 122/81 119/71  Pulse: 74 73  Resp: 18 (!) 22  Temp:    SpO2: 100% 100%    Last Pain:  Vitals:   05/30/17 0926  TempSrc: Tympanic                 Precious Haws Piscitello

## 2017-05-30 NOTE — H&P (Signed)
  Tamara Bellows MD [address], [place], [place] [address] Phone: [phone] Fax : [phone]  Primary Care Physician:  Tamara Hawthorne, FNP Primary Gastroenterologist:  Dr. Jonathon Archer   Pre-Procedure History & Physical: HPI:  [name] is a 41 y.o. female is here for an colonoscopy.   History reviewed. No pertinent past medical history.  Past Surgical History:  Procedure Laterality Date  . c-sections x 2      Prior to Admission medications   Medication Sig Start Date End Date Taking? Authorizing Provider  Acetaminophen (TYLENOL 8 HOUR PO) Take by mouth.   Yes [provider]  cyclobenzaprine (FLEXERIL) 5 MG tablet Take 1 tablet (5 mg total) by mouth at bedtime. Take one to tablets at bedtime for muscle spasm,  Low back pain. 04/14/17  Yes Tamara Hawthorne, FNP  Multiple Vitamin (MULTI-VITAMINS) TABS Take by mouth.   Yes [provider]    Allergies as of 04/15/2017 - Review Complete 04/14/2017  Allergen Reaction Noted  . Morphine  01/25/2013    Family History  Problem Relation Age of Onset  . Breast cancer Maternal Aunt   . Breast cancer Paternal Aunt     Social History   Social History  . Marital status: Married    Spouse name: N/A  . Number of children: N/A  . Years of education: N/A   Occupational History  . Not on file.   Social History Main Topics  . Smoking status: Light Tobacco Smoker  . Smokeless tobacco: Never Used  . Alcohol use Yes  . Drug use: No  . Sexual activity: Yes    Partners: Male   Other Topics Concern  . Not on file   Social History Narrative   Works at [employer]    Review of Systems: See HPI, otherwise negative ROS  Physical Exam: BP 121/84   Pulse 80   Temp 97.6 F (36.4 C) (Tympanic)   Resp 20   Ht 5\' 3"  (1.6 m)   Wt 248 lb (112.5 kg)   SpO2 100%   BMI 43.93 kg/m  General:   Alert,  pleasant and cooperative in NAD Head:  Normocephalic and atraumatic. Neck:  Supple; no masses  or thyromegaly. Lungs:  Clear throughout to auscultation.    Heart:  Regular rate and rhythm. Abdomen:  Soft, nontender and nondistended. Normal bowel sounds, without guarding, and without rebound.   Neurologic:  Alert and  oriented x4;  grossly normal neurologically.  Impression/Plan: [name] is here for an colonoscopy to be performed for colonoscopy with a family history of colon polyps   Risks, benefits, limitations, and alternatives regarding  colonoscopy have been reviewed with the patient.  Questions have been answered.  All parties agreeable.   Tamara Bellows, MD  05/30/2017, 8:56 AM

## 2017-06-02 ENCOUNTER — Encounter: Payer: Self-pay | Admitting: Gastroenterology

## 2017-06-02 LAB — SURGICAL PATHOLOGY

## 2017-06-04 NOTE — Telephone Encounter (Signed)
We still have yet to receive a form from [facility].

## 2017-06-06 NOTE — Telephone Encounter (Signed)
Closing note until we receive forms.

## 2017-06-08 ENCOUNTER — Encounter: Payer: Self-pay | Admitting: Gastroenterology

## 2017-06-16 ENCOUNTER — Encounter: Payer: BLUE CROSS/BLUE SHIELD | Admitting: Family

## 2017-06-16 DIAGNOSIS — Z0289 Encounter for other administrative examinations: Secondary | ICD-10-CM

## 2017-10-09 ENCOUNTER — Emergency Department
Admission: EM | Admit: 2017-10-09 | Discharge: 2017-10-09 | Disposition: A | Discharge status: Home / Self Care | Payer: BLUE CROSS/BLUE SHIELD | Attending: Emergency Medicine | Admitting: Emergency Medicine

## 2017-10-09 ENCOUNTER — Encounter: Payer: Self-pay | Admitting: Emergency Medicine

## 2017-10-09 ENCOUNTER — Other Ambulatory Visit: Payer: Self-pay

## 2017-10-09 DIAGNOSIS — F17210 Nicotine dependence, cigarettes, uncomplicated: Secondary | ICD-10-CM | POA: Insufficient documentation

## 2017-10-09 DIAGNOSIS — T7840XA Allergy, unspecified, initial encounter: Secondary | ICD-10-CM | POA: Diagnosis not present

## 2017-10-09 DIAGNOSIS — Z79899 Other long term (current) drug therapy: Secondary | ICD-10-CM | POA: Diagnosis not present

## 2017-10-09 DIAGNOSIS — R220 Localized swelling, mass and lump, head: Secondary | ICD-10-CM | POA: Diagnosis present

## 2017-10-09 MED ORDER — PREDNISONE 20 MG PO TABS: 40.0000 mg | ORAL_TABLET | Freq: Every day | ORAL | 0 refills | Status: DC

## 2017-10-09 MED ORDER — METHYLPREDNISOLONE SODIUM SUCC 125 MG IJ SOLR
125.0000 mg | Freq: Once | INTRAMUSCULAR | Status: AC
  Administered 2017-10-09: 125 mg via INTRAVENOUS
  Filled 2017-10-09: qty 2

## 2017-10-09 MED ORDER — SODIUM CHLORIDE 0.9 % IV BOLUS (SEPSIS)
1000.0000 mL | Freq: Once | INTRAVENOUS | Status: AC
  Administered 2017-10-09: 1000 mL via INTRAVENOUS

## 2017-10-09 MED ORDER — DIPHENHYDRAMINE HCL 50 MG/ML IJ SOLN
50.0000 mg | Freq: Once | INTRAMUSCULAR | Status: AC
  Administered 2017-10-09: 50 mg via INTRAVENOUS
  Filled 2017-10-09: qty 1

## 2017-10-09 NOTE — ED Notes (Signed)
Esign not working, pt verbalized discharge instructions and has no questions at this time

## 2017-10-09 NOTE — ED Triage Notes (Signed)
Pt reports that yesterday she was broke out in hives today she woke up and had swelling on the left side of her face. States that the last week she started drinking slim fast which is new and tuna which is not new. States that she feels like she is having a little trouble breathing.

## 2017-10-09 NOTE — ED Provider Notes (Signed)
[facility] Emergency Department Provider Note  Time seen: 10:05 AM  I have reviewed the triage vital signs and the nursing notes.   HISTORY  Chief Complaint Facial Swelling    HPI [name] is a 42 y.o. female with no significant past medical history who presents to the emergency department for facial swelling and rash.  According to the patient for the past 2-3 days she has noticed intermittent hives and itching to her upper extremities and torso.  Patient has been using Benadryl and states this makes the rash resolve but then it comes back hours later.  This morning she awoke with significant lower lip and left facial swelling.  States yesterday she noticed that her right hand was somewhat swollen.  Patient denies any known allergies besides morphine.  States she has been using Slim fast over the past 1 and half weeks which is new for her but she stopped it 2 days ago.  Denies any prescription medications or blood pressure medications.  Denies any trouble breathing at this point.  Denies any fever.   History reviewed. No pertinent past medical history.  Patient Active Problem List   Diagnosis Date Noted  . Family history of colon cancer in father 04/14/2017  . Encounter to establish care 04/14/2017  . Chronic left-sided low back pain with sciatica 04/14/2017    Past Surgical History:  Procedure Laterality Date  . c-sections x 2    . COLONOSCOPY WITH PROPOFOL N/A 05/30/2017   Procedure: COLONOSCOPY WITH PROPOFOL;  Surgeon: Jonathon Bellows, MD;  Location: [facility] ENDOSCOPY;  Service: Gastroenterology;  Laterality: N/A;    Prior to Admission medications   Medication Sig Start Date End Date Taking? Authorizing Provider  Acetaminophen (TYLENOL 8 HOUR PO) Take by mouth.    [provider]  cyclobenzaprine (FLEXERIL) 5 MG tablet Take 1 tablet (5 mg total) by mouth at bedtime. Take one to tablets at bedtime for muscle spasm,  Low back pain. 04/14/17    Burnard Hawthorne, FNP  Multiple Vitamin (MULTI-VITAMINS) TABS Take by mouth.    [provider]    Allergies  Allergen Reactions  . Morphine     Family History  Problem Relation Age of Onset  . Breast cancer Maternal Aunt   . Breast cancer Paternal Aunt     Social History Social History   Tobacco Use  . Smoking status: Light Tobacco Smoker  . Smokeless tobacco: Never Used  Substance Use Topics  . Alcohol use: Yes  . Drug use: No    Review of Systems Constitutional: Negative for fever. Eyes: Negative for visual complaints ENT: Lower lip swelling Cardiovascular: Negative for chest pain. Respiratory: Negative for shortness of breath. Gastrointestinal: Negative for abdominal pain, vomiting Genitourinary: Negative for urinary compaints Musculoskeletal: Right hand swelling Skin: Intermittent hives over her upper extremities over the past 2 days Neurological: Negative for headache All other ROS negative  ____________________________________________   PHYSICAL EXAM:  VITAL SIGNS: ED Triage Vitals  Enc Vitals Group     BP 10/09/17 0939 129/79     Pulse Rate 10/09/17 0939 91     Resp 10/09/17 0939 20     Temp 10/09/17 0939 (!) 97.5 F (36.4 C)     Temp Source 10/09/17 0939 Oral     SpO2 10/09/17 0939 97 %     Weight 10/09/17 0939 250 lb (113.4 kg)     Height 10/09/17 0939 5\' 3"  (1.6 m)     Head Circumference --  Peak Flow --      Pain Score 10/09/17 0938 6     Pain Loc --      Pain Edu? --      Excl. in [place]? --     Constitutional: Alert and oriented. Well appearing and in no distress. Eyes: Normal exam ENT   Head: Normocephalic and atraumatic.   Nose: No congestion/rhinnorhea.        Mouth/Throat: Mucous membranes are moist.  Moderate edema of the patient's lower lip especially left side of the lower lip.  No intraoral edema.  Normal tongue and oropharynx. Cardiovascular: Normal rate, regular rhythm. No murmur Respiratory: Normal  respiratory effort without tachypnea nor retractions. Breath sounds are clear  Gastrointestinal: Soft and nontender. No distention.   Musculoskeletal: Nontender with normal range of motion in all extremities. Neurologic:  Normal speech and language. No gross focal neurologic deficits Skin:  Skin is warm, dry.  Several small areas of rash, possible hives right upper extremity.  Slight edema to the right hand but also states some edema to the left hand as well. Psychiatric: Mood and affect are normal. Speech and behavior are normal.   ____________________________________________   INITIAL IMPRESSION / ASSESSMENT AND PLAN / ED COURSE  Pertinent labs & imaging results that were available during my care of the patient were reviewed by me and considered in my medical decision making (see chart for details).  Patient presents to the emergency department with symptoms consistent with an allergic reaction versus angioedema.  Differential would include allergic reaction, angioedema, medication reaction.  Patient denies any new medications or blood pressure medications.  Patient has intermittent hives has a few very small hives to the right upper extremity at this time with moderate lower lip edema.  Most consistent with allergic reaction to unknown antigen.  We will dose IV steroids, Benadryl, fluids and closely monitor the patient in the emergency department.  She has clear lung sounds, no intraoral edema or pharyngeal edema.  I discussed with the patient given the severity of her reaction and no known allergies besides morphine it would be prudent to follow-up with an allergist for further testing and she is agreeable to this plan of care.  We will dose steroids and Benadryl and watch closely in the emergency department of the next couple hours.  ----------------------------------------- 12:49 PM on 10/09/2017 -----------------------------------------  Patient appears well, no acute distress.  Hives  have resolved.  Denies any trouble breathing.  Continues have lower lip swelling largely unchanged first possibly slightly diminished.  No intraoral swelling or edema.  Patient feels safe going home I discussed strict return precautions for any trouble breathing, wheeze or swelling in her mouth tongue or throat.  Patient agreeable to this plan of care.  We will discharge with a prednisone course as well as over-the-counter Benadryl every 6 hours as needed.  I discussed return precautions I also discussed follow-up with an allergist.  Patient agreeable to plan.  ____________________________________________   FINAL CLINICAL IMPRESSION(S) / ED DIAGNOSES  Allergic reaction    Harvest Dark, MD 10/09/17 1250

## 2017-10-31 ENCOUNTER — Other Ambulatory Visit: Payer: Self-pay

## 2017-10-31 ENCOUNTER — Ambulatory Visit: Payer: Self-pay

## 2017-10-31 MED ORDER — FEXOFENADINE HCL 180 MG PO TABS: 180.0000 mg | ORAL_TABLET | Freq: Two times a day (BID) | ORAL | 1 refills | Status: AC

## 2017-10-31 MED ORDER — FAMOTIDINE 20 MG PO TABS: 20.0000 mg | ORAL_TABLET | Freq: Two times a day (BID) | ORAL | 1 refills | Status: DC

## 2017-10-31 MED ORDER — FEXOFENADINE HCL 180 MG PO TABS: 180.0000 mg | ORAL_TABLET | Freq: Two times a day (BID) | ORAL | 1 refills | Status: DC

## 2017-10-31 NOTE — Addendum Note (Signed)
Addended by: Crecencio Mc on: 10/31/2017 12:56 PM   Modules accepted: Orders

## 2017-10-31 NOTE — Telephone Encounter (Signed)
Please advise 

## 2017-10-31 NOTE — Telephone Encounter (Signed)
Pt. Seen in ED last month for this. Has an appointment with an allergist in 2 weeks per pt. Instructed to go to ED. Verbalizes understanding.  Reason for Disposition . [1] Severe swelling AND [2] cause unknown  Answer Assessment - Initial Assessment Questions 1. ONSET: "When did the swelling start?" (e.g., minutes, hours, days)     Started last week 2. LOCATION: "What part of the face is swollen?"      Lip - left side 3. SEVERITY: "How swollen is it?"     Moderate 4. ITCHING: "Is there any itching?" If so, ask: "How much?"   (Scale 1-10; mild, moderate or severe)     No 5. PAIN: "Is the swelling painful to touch?" If so, ask: "How painful is it?"   (Scale 1-10; mild, moderate or severe)     No 6. FEVER: "Do you have a fever?" If so, ask: "What is it, how was it measured, and when did it start?"      No 7. CAUSE: "What do you think is causing the face swelling?"     Unsure - had this before. Seen in ED. 8. RECURRENT SYMPTOM: "Have you had face swelling before?" If so, ask: "When was the last time?" "What happened that time?"     Yes. In January. 9. OTHER SYMPTOMS: "Do you have any other symptoms?" (e.g., toothache, leg swelling)     No 10. PREGNANCY: "Is there any chance you are pregnant?" "When was your last menstrual period?"       No  Protocols used: LIP SWELLING-A-AH, FACE [facility]

## 2017-10-31 NOTE — Telephone Encounter (Signed)
Spoke with patient to follow up regarding triage call regarding lip swelling.  No trouble breathing. She has taken 1 Benadryl.  She is still at work will take her 2-3 hours to finish up.  Co-worker are aware of situation of lip swelling urged her to go on to ED .  However she told me she needed to finish up work.  Her co-worker are aware of situation with lip swelling.   They will call 911 immediately for patient if patient has experiencing trouble breathing.

## 2017-10-31 NOTE — Telephone Encounter (Signed)
Patient advised of below and verbalized understanding. If symptoms get worse she will go to ED for evaluation.   Advised scripts sent to pharmacy.

## 2017-10-31 NOTE — Telephone Encounter (Signed)
See triage message 10/31/17 -verbal orders

## 2017-10-31 NOTE — Addendum Note (Signed)
Addended by: Johna Sheriff on: 10/31/2017 02:24 PM   Modules accepted: Orders

## 2017-10-31 NOTE — Telephone Encounter (Signed)
This is her second episode per review of chart.  She should have been seen after ER visit in january and told to take a daily anthistamine Allegra 180 mg  2 TImes daily   Along with famotidine 20 mg every 12 hours to prevent recurrence until she can be seen by allergist  . These are both available otc ,  Bit I will send rx to her pharmacy

## 2017-11-24 ENCOUNTER — Other Ambulatory Visit: Payer: Self-pay

## 2017-11-24 ENCOUNTER — Ambulatory Visit: Payer: Self-pay

## 2017-11-24 ENCOUNTER — Encounter: Payer: Self-pay | Admitting: Emergency Medicine

## 2017-11-24 ENCOUNTER — Emergency Department
Admission: EM | Admit: 2017-11-24 | Discharge: 2017-11-24 | Disposition: A | Discharge status: Home / Self Care | Payer: BLUE CROSS/BLUE SHIELD | Attending: Emergency Medicine | Admitting: Emergency Medicine

## 2017-11-24 DIAGNOSIS — F17200 Nicotine dependence, unspecified, uncomplicated: Secondary | ICD-10-CM | POA: Insufficient documentation

## 2017-11-24 DIAGNOSIS — Z79899 Other long term (current) drug therapy: Secondary | ICD-10-CM | POA: Diagnosis not present

## 2017-11-24 DIAGNOSIS — L239 Allergic contact dermatitis, unspecified cause: Secondary | ICD-10-CM | POA: Insufficient documentation

## 2017-11-24 DIAGNOSIS — R2231 Localized swelling, mass and lump, right upper limb: Secondary | ICD-10-CM | POA: Diagnosis present

## 2017-11-24 MED ORDER — TRIAMCINOLONE ACETONIDE 0.5 % EX OINT: 1.0000 "application " | TOPICAL_OINTMENT | Freq: Two times a day (BID) | CUTANEOUS | 0 refills | Status: DC

## 2017-11-24 MED ORDER — PREDNISONE 20 MG PO TABS: 40.0000 mg | ORAL_TABLET | Freq: Every day | ORAL | 0 refills | Status: DC

## 2017-11-24 NOTE — ED Notes (Signed)
See triage note  States she noticed some swelling to right arm  Swelling noted to [facility] area   States she put some hydrocortisone cream on area and took a benadryl  Area is still swollen but has decreased

## 2017-11-24 NOTE — Discharge Instructions (Signed)
Keep your appointment with the allergy specialist in 2 weeks.  Begin taking prednisone 2 tablets once a day for the next 5 days.  Triamcinolone cream applied to area as needed for itching.

## 2017-11-24 NOTE — ED Triage Notes (Signed)
Swelling to upper right arm/ pain no injury, has taken benadryl and Cortizone cream with decreased symptoms

## 2017-11-24 NOTE — ED Provider Notes (Signed)
[facility] Emergency Department Provider Note  ____________________________________________   First MD Initiated Contact with Patient 11/24/17 [id number]     (approximate)  I have reviewed the triage vital signs and the nursing notes.   HISTORY  Chief Complaint Abscess  HPI [name] is a 42 y.o. female is here with complaint of swelling to her right upper arm that began last evening.  Patient states that last night she took Benadryl and use cortisone cream to the area which decreased her symptoms of itching.  This morning she woke up and is still swollen but improved from last night.  She denies any difficulty breathing or swallowing.  She has had no wheezing.  She is unaware of any allergen exposure however patient has an appointment in 2 weeks for allergy testing.  Patient states that she has had similar episodes in the past which were relieved with cortisone cream.   History reviewed. No pertinent past medical history.  Patient Active Problem List   Diagnosis Date Noted  . Family history of colon cancer in father 04/14/2017  . Encounter to establish care 04/14/2017  . Chronic left-sided low back pain with sciatica 04/14/2017    Past Surgical History:  Procedure Laterality Date  . c-sections x 2    . COLONOSCOPY WITH PROPOFOL N/A 05/30/2017   Procedure: COLONOSCOPY WITH PROPOFOL;  Surgeon: Jonathon Bellows, MD;  Location: [facility] ENDOSCOPY;  Service: Gastroenterology;  Laterality: N/A;    Prior to Admission medications   Medication Sig Start Date End Date Taking? Authorizing Provider  famotidine (PEPCID) 20 MG tablet Take 1 tablet (20 mg total) by mouth 2 (two) times daily. 10/31/17   Crecencio Mc, MD  fexofenadine (ALLEGRA) 180 MG tablet Take 1 tablet (180 mg total) by mouth 2 (two) times daily. 10/31/17   Crecencio Mc, MD  predniSONE (DELTASONE) 20 MG tablet Take 2 tablets (40 mg total) by mouth daily. 11/24/17   Johnn Hai, PA-C  triamcinolone  ointment (KENALOG) 0.5 % Apply 1 application topically 2 (two) times daily. 11/24/17   Johnn Hai, PA-C    Allergies Morphine  Family History  Problem Relation Age of Onset  . Breast cancer Maternal Aunt   . Breast cancer Paternal Aunt     Social History Social History   Tobacco Use  . Smoking status: Light Tobacco Smoker  . Smokeless tobacco: Never Used  Substance Use Topics  . Alcohol use: Yes  . Drug use: No    Review of Systems Constitutional: No fever/chills Cardiovascular: Denies chest pain. Respiratory: Denies shortness of breath. Gastrointestinal: No abdominal pain.  No nausea, no vomiting.   Musculoskeletal: Negative for back pain. Skin: Right upper extremity with localized soft tissue edema and mild erythema surrounding the antecubital fossa area.  No warmth or tenderness is noted.  There is not any pustules or papules noted.  Drainage is present. Neurological: Negative for headaches, focal weakness or numbness. ___________________________________________   PHYSICAL EXAM:  VITAL SIGNS: ED Triage Vitals  Enc Vitals Group     BP 11/24/17 0831 120/70     Pulse Rate 11/24/17 0831 92     Resp 11/24/17 0831 18     Temp 11/24/17 0831 98.2 F (36.8 C)     Temp Source 11/24/17 0831 Oral     SpO2 11/24/17 0831 99 %     Weight 11/24/17 0832 252 lb (114.3 kg)     Height 11/24/17 0832 5\' 3"  (1.6 m)  Head Circumference --      Peak Flow --      Pain Score 11/24/17 0832 2     Pain Loc --      Pain Edu? --      Excl. in [place]? --    Constitutional: Alert and oriented. Well appearing and in no acute distress. Eyes: Conjunctivae are normal.  Head: Atraumatic. Mouth/Throat: Mucous membranes are moist.  Oropharynx non-erythematous.  No edema noted posterior pharynx. Neck: No stridor.   Cardiovascular: Normal rate, regular rhythm. Grossly normal heart sounds.  Good peripheral circulation. Respiratory: Normal respiratory effort.  No retractions. Lungs  CTAB. Musculoskeletal: No lower extremity tenderness nor edema.   Neurologic:  Normal speech and language. No gross focal neurologic deficits are appreciated.  Skin:  Skin is warm, dry and intact.  Psychiatric: Mood and affect are normal. Speech and behavior are normal.  ____________________________________________   LABS (all labs ordered are listed, but only abnormal results are displayed)  Labs Reviewed - No data to display   PROCEDURES  Procedure(s) performed: None  Procedures  Critical Care performed: No  ____________________________________________   INITIAL IMPRESSION / ASSESSMENT AND PLAN / ED COURSE Patient has a picture of her arm from last evening in comparison to today has improved with Benadryl and cortisone cream.  Patient was using triamcinolone and has completely use the tube.  Patient was started on prednisone and given another prescription for triamcinolone ointment to apply to area twice daily.  She is encouraged to keep her appointment with the allergy specialist in 2 weeks.  ____________________________________________   FINAL CLINICAL IMPRESSION(S) / ED DIAGNOSES  Final diagnoses:  Allergic dermatitis     ED Discharge Orders        Ordered    predniSONE (DELTASONE) 20 MG tablet  Daily     11/24/17 0908    triamcinolone ointment (KENALOG) 0.5 %  2 times daily     11/24/17 0908       Note:  This document was prepared using Dragon voice recognition software and may include unintentional dictation errors.    Johnn Hai, PA-C 11/24/17 1330    Schuyler Amor, MD 11/24/17 1447

## 2017-11-24 NOTE — Telephone Encounter (Signed)
Pt. Reports the itching and swelling woke her up durinh the night. Reports she has an appointment with an allergist, but not until the middle of March. Pt. Reports she is outside of the ED now and will go in for evaluation.  Reason for Disposition . [1] Red area or streak AND [2] large (> 2 in. or 5 cm)  Answer Assessment - Initial Assessment Questions 1. ONSET: "When did the pain start?"     During the night 2. LOCATION: "Where is the pain located?"     Middle of arm on inner aspect 3. PAIN: "How bad is the pain?" (Scale 1-10; or mild, moderate, severe)   - MILD (1-3): doesn't interfere with normal activities   - MODERATE (4-7): interferes with normal activities (e.g., work or school) or awakens from sleep   - SEVERE (8-10): excruciating pain, unable to do any normal activities, unable to hold a cup of water     Moderate - woke pt. Up during the night 4. WORK OR EXERCISE: "Has there been any recent work or exercise that involved this part of the body?"     No 5. CAUSE: "What do you think is causing the arm pain?"     Allergic reaction 6. OTHER SYMPTOMS: "Do you have any other symptoms?" (e.g., neck pain, swelling, rash, fever, numbness, weakness)     Some swelling 7. PREGNANCY: "Is there any chance you are pregnant?" "When was your last menstrual period?"     No  Protocols used: ARM PAIN-A-AH

## 2018-05-08 ENCOUNTER — Ambulatory Visit: Payer: BLUE CROSS/BLUE SHIELD | Admitting: Family

## 2018-05-08 ENCOUNTER — Encounter: Payer: Self-pay | Admitting: Family

## 2018-05-08 VITALS — BP 118/87 | HR 78 | Temp 98.0°F | Resp 16 | Wt 265.1 lb

## 2018-05-08 DIAGNOSIS — Z1239 Encounter for other screening for malignant neoplasm of breast: Secondary | ICD-10-CM

## 2018-05-08 DIAGNOSIS — Z1231 Encounter for screening mammogram for malignant neoplasm of breast: Secondary | ICD-10-CM

## 2018-05-08 DIAGNOSIS — L509 Urticaria, unspecified: Secondary | ICD-10-CM

## 2018-05-08 DIAGNOSIS — M7989 Other specified soft tissue disorders: Secondary | ICD-10-CM

## 2018-05-08 MED ORDER — TRIAMCINOLONE ACETONIDE 0.5 % EX OINT
1.0000 | TOPICAL_OINTMENT | Freq: Two times a day (BID) | CUTANEOUS | 0 refills | Status: DC
Start: 2018-05-08 — End: 2018-07-13

## 2018-05-08 MED ORDER — EPINEPHRINE 0.3 MG/0.3ML IJ SOAJ: 0.3000 mg | Freq: Once | INTRAMUSCULAR | 1 refills | Status: AC

## 2018-05-08 MED ORDER — HYDROCHLOROTHIAZIDE 12.5 MG PO CAPS: 12.5000 mg | ORAL_CAPSULE | Freq: Every day | ORAL | 0 refills | Status: DC

## 2018-05-08 NOTE — Patient Instructions (Addendum)
Labs Monday at 4pm  May start HCTZ for leg swelling. Be careful as this medication will lower blood pressure. Let me know if you feel lightheaded or dizzy.   Epi Pen. Please pick up ; I want you to be safe.   Continue low salt  Compression socks. Let me know if swelling gets worse.   We placed a referral for mammogram this year. I asked that you call one the below locations and schedule this when it is convenient for you.   As discussed, I would like you to ask for 3D mammogram over the traditional 2D mammogram as new evidence suggest 3D is superior.   Please note that NOT all insurance companies cover 3D and you may have to pay a higher copay. You may call your insurance company to further clarify your benefits.   Options for [place]  [place], [place]  * [facility] [place], [place] * Note if you ask for 3D mammogram at this location, you must request [place], [place] location*     Hives of unknown cause   Tips on Aggravating factors -    These include:  ?Physical factors -  As an example, heat (hot showers, extreme humidity) is a common trigger for many , and tight clothing or straps can also aggravate symptoms.   ?Anti-inflammatory medications - Nonsteroidal anti-inflammatory drugs (NSAIDs) worsen symptoms   ?Stress - Patients often report more severe symptoms during periods of physical or psychologic stress   ?Variations in dietary habits and alcohol - Although food allergy is a rare cause of, some patients will report that variations in diet, particularly rich meals or spicy foods, will aggravate symptoms. Alcohol also aggravates symptoms in some.  If hives do not improve, please let me know and remember :   Histamine 1 blocker - Allegra ( you are on this),   claritin or zyrtec once daily  Histamine 2 blocker-  Pepcid AC or Zantac twice a day

## 2018-05-08 NOTE — Progress Notes (Signed)
Subjective:    Patient ID: [name], female    DOB: 11-Jan-1976, 42 y.o.   MRN: [id number]  CC: [name] is a 42 y.o. female who presents today for follow up.   HPI: Rash and swelling of arms, legs, for 8 months, waxed and waned. No rash today. No SOB, trouble swallowing.   Saw an allergist couple of months ago,  patient states not allergic to anything. ( Unable to see these notes). Allergist prescribed an epi pen however was too expensive so she didn't purchase at that time.   started on fexofenadine 10/2017 from our office and has noted less episodes of rash.  Rashes also respond to prednsione. Describes localized swelling, it seems to 'move' from one arm to the other.     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 No known food allergies.   No asthma. H/o eczema.   No family h/o autoimmune disease.   She also complains of biltaeral swelling and 'tightness' on top of feet, for the past 2 months, waxes and goes. Worse after sitting for long period time. No numbness, tingling. No rash. Wears compression socks 2x per week with no relief.  Swelling resolves with elevation. No pain in calves with walking. Low salt diet. No sob, cough, cp.  Would like a medication for this.   Sleeps propped up 3 pillows  Out of habit and comfort. Family h/o HF.      ED 09/2017 allergic reaction; had lip swelling, left facial swelling Itching upper back, torso. Symptoms resolved with prednisone and then 3 weeks later, itching, hives, swelling ( from BL arms, to BL legs)  returned.   ED 11/2017 allergic reaction- No lip swelling at that visit. started on prednisone, triamcinolone with temporary resolves.    HISTORY:  No past medical history on file. Past Surgical History:  Procedure Laterality Date  . c-sections x 2    . COLONOSCOPY WITH PROPOFOL N/A 05/30/2017   Procedure: COLONOSCOPY WITH PROPOFOL;  Surgeon: Jonathon Bellows, MD;  Location: [facility] ENDOSCOPY;   Service: Gastroenterology;  Laterality: N/A;   Family History  Problem Relation Age of Onset  . Breast cancer Maternal Aunt   . Breast cancer Paternal Aunt     Allergies: Morphine Current Outpatient Medications on File Prior to Visit  Medication Sig Dispense Refill  . fexofenadine (ALLEGRA) 180 MG tablet Take 1 tablet (180 mg total) by mouth 2 (two) times daily. 60 tablet 1   No current facility-administered medications on file prior to visit.     Social History   Tobacco Use  . Smoking status: Light Tobacco Smoker  . Smokeless tobacco: Never Used  Substance Use Topics  . Alcohol use: Yes  . Drug use: No    Review of Systems  Constitutional: Negative for chills and fever.  Respiratory: Negative for cough and shortness of breath.   Cardiovascular: Positive for leg swelling. Negative for chest pain and palpitations.  Gastrointestinal: Negative for nausea and vomiting.  Musculoskeletal: Negative for arthralgias and joint swelling.  Skin: Positive for rash.      Objective:    BP 118/87 (BP Location: Left Arm, Patient Position: Sitting, Cuff Size: Large)   Pulse 78   Temp 98 F (36.7 C) (Oral)   Resp 16   Wt 265 lb 2 oz (120.3 kg)   SpO2 97%   BMI 46.96 kg/m  BP Readings from Last 3 Encounters:  05/08/18 118/87  11/24/17 120/70  10/09/17 (!) 112/56   Wt Readings from Last 3 Encounters:  05/08/18 265 lb 2 oz (120.3 kg)  11/24/17 252 lb (114.3 kg)  10/09/17 250 lb (113.4 kg)    Physical Exam  Constitutional: She appears well-developed and well-nourished.  Eyes: Conjunctivae are normal.  Cardiovascular: Normal rate, regular rhythm, normal heart sounds and normal pulses.  No LE edema, palpable cords or masses. No erythema or increased warmth. No asymmetry in calf size when compared bilaterally LE hair growth symmetric and present. No discoloration of varicosities noted. LE warm and palpable pedal pulses.   Pulmonary/Chest: Effort normal and breath sounds  normal. She has no wheezes. She has no rhonchi. She has no rales.  Neurological: She is alert.  Skin: Skin is warm and dry.  No rash.   Psychiatric: She has a normal mood and affect. Her speech is normal and behavior is normal. Thought content normal.  Vitals reviewed.      Assessment & Plan:   Problem List Items Addressed This Visit      Musculoskeletal and Integument   Hives    No rash present today.  Discussed with patient prior emergency room visits, presentation and treatment of rashes, no known etiology thus far.  Epi Pen prescribed and I have advised patient to keep one with her at all times.  Discussed with her that she may stay on the Allegra and may add Zantac for complete histamine blockade.  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  She will let me know if hives recur.      Relevant Medications   triamcinolone ointment (KENALOG) 0.5 %     Other   Leg swelling - Primary    No appreciable edema on exam today.  Based on patient preference regarding complaint, she would like a medication she may take as needed for swelling.  Will start very low-dose hydrochlorothiazide with close attention to her blood pressure as I explained she runs on the low side.  She will also return for repeat BMP.  Also advised compression stockings, elevation, low-salt as conservative, first-line measures.  Close vigilance.      Relevant Medications   hydrochlorothiazide (MICROZIDE) 12.5 MG capsule   Other Relevant Orders   DG Chest 2 View   Comprehensive metabolic panel   B Nat Peptide   Screening for breast cancer    Ordered, patient understands schedule.      Relevant Orders   MM 3D SCREEN BREAST BILATERAL       I have discontinued Jeannia M. Martinezgarcia's famotidine and predniSONE. I am also having her start on EPINEPHrine and hydrochlorothiazide. Additionally, I am having her maintain her fexofenadine and  triamcinolone ointment.   Meds ordered this encounter  Medications  . triamcinolone ointment (KENALOG) 0.5 %    Sig: Apply 1 application topically 2 (two) times daily.    Dispense:  30 g    Refill:  0  . EPINEPHrine 0.3 mg/0.3 mL IJ SOAJ injection    Sig: Inject 0.3 mLs (0.3 mg total) into the muscle once for 1 dose.    Dispense:  1 Device    Refill:  1    Order Specific Question:   Supervising Provider    Answer:   Deborra Medina L [[id number]]  . hydrochlorothiazide (MICROZIDE) 12.5 MG capsule    Sig: Take 1 capsule (12.5 mg total) by mouth daily.    Dispense:  90 capsule    Refill:  0    Order Specific Question:   Supervising Provider    Answer:   Crecencio Mc [[id number]]    Return precautions given.   Risks, benefits, and alternatives of the medications and treatment plan prescribed today were discussed, and patient expressed understanding.   Education regarding symptom management and diagnosis given to patient on AVS.  Continue to follow with Burnard Hawthorne, FNP for routine health maintenance.   [name] and I agreed with plan.   Mable Paris, FNP

## 2018-05-10 DIAGNOSIS — M7989 Other specified soft tissue disorders: Secondary | ICD-10-CM | POA: Insufficient documentation

## 2018-05-10 DIAGNOSIS — Z1239 Encounter for other screening for malignant neoplasm of breast: Secondary | ICD-10-CM | POA: Insufficient documentation

## 2018-05-10 DIAGNOSIS — L509 Urticaria, unspecified: Secondary | ICD-10-CM | POA: Insufficient documentation

## 2018-05-10 NOTE — Assessment & Plan Note (Signed)
Ordered, patient understands schedule.

## 2018-05-10 NOTE — Assessment & Plan Note (Addendum)
No rash present today.  Discussed with patient prior emergency room visits, presentation and treatment of rashes, no known etiology thus far.  Epi Pen prescribed and I have advised patient to keep one with her at all times.  Discussed with her that she may stay on the Allegra and may add Zantac for complete histamine blockade.  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  She will let me know if hives recur.

## 2018-05-10 NOTE — Assessment & Plan Note (Signed)
No appreciable edema on exam today.  Based on patient preference regarding complaint, she would like a medication she may take as needed for swelling.  Will start very low-dose hydrochlorothiazide with close attention to her blood pressure as I explained she runs on the low side.  She will also return for repeat BMP.  Also advised compression stockings, elevation, low-salt as conservative, first-line measures.  Close vigilance.

## 2018-05-11 ENCOUNTER — Other Ambulatory Visit (INDEPENDENT_AMBULATORY_CARE_PROVIDER_SITE_OTHER): Payer: BLUE CROSS/BLUE SHIELD

## 2018-05-11 DIAGNOSIS — M7989 Other specified soft tissue disorders: Secondary | ICD-10-CM

## 2018-05-11 NOTE — Addendum Note (Signed)
Addended by: Arby Barrette on: 05/11/2018 04:19 PM   Modules accepted: Orders

## 2018-05-12 ENCOUNTER — Telehealth: Payer: Self-pay | Admitting: Family

## 2018-05-12 LAB — COMPREHENSIVE METABOLIC PANEL
ALT: 8 U/L (ref 0–35)
AST: 11 U/L (ref 0–37)
Albumin: 3.9 g/dL (ref 3.5–5.2)
Alkaline Phosphatase: 76 U/L (ref 39–117)
BUN: 11 mg/dL (ref 6–23)
CALCIUM: 9.3 mg/dL (ref 8.4–10.5)
CHLORIDE: 105 meq/L (ref 96–112)
CO2: 23 meq/L (ref 19–32)
Creatinine, Ser: 1.19 mg/dL (ref 0.40–1.20)
GFR: 63.65 mL/min (ref >60.00)
Glucose, Bld: 150 mg/dL — ABNORMAL HIGH (ref 70–99)
Potassium: 3.7 mEq/L (ref 3.5–5.1)
Sodium: 136 mEq/L (ref 135–145)
Total Bilirubin: 0.4 mg/dL (ref 0.2–1.2)
Total Protein: 7.8 g/dL (ref 6.0–8.3)

## 2018-05-12 LAB — BRAIN NATRIURETIC PEPTIDE: Brain Natriuretic Peptide: 7 pg/mL (ref <100)

## 2018-05-12 NOTE — Telephone Encounter (Signed)
Copied from [place] [id number]. Topic: General - Other >> May 12, 2018  2:32 PM Yvette Rack wrote: Reason for CRM: pt calling back about lab results

## 2018-05-12 NOTE — Telephone Encounter (Signed)
Ok, then a glucose of 150 is fine.  LG

## 2018-05-12 NOTE — Telephone Encounter (Signed)
Called and notified patient of lab results patient states that she was not fasting when she had labs done.

## 2018-05-13 NOTE — Telephone Encounter (Signed)
Patient advised of below  

## 2018-05-15 ENCOUNTER — Ambulatory Visit (INDEPENDENT_AMBULATORY_CARE_PROVIDER_SITE_OTHER): Payer: BLUE CROSS/BLUE SHIELD

## 2018-05-15 DIAGNOSIS — M7989 Other specified soft tissue disorders: Secondary | ICD-10-CM

## 2018-06-19 ENCOUNTER — Encounter: Payer: Self-pay | Admitting: Family

## 2018-07-08 ENCOUNTER — Ambulatory Visit: Payer: BLUE CROSS/BLUE SHIELD | Admitting: Family

## 2018-07-13 ENCOUNTER — Ambulatory Visit: Payer: BLUE CROSS/BLUE SHIELD | Admitting: Family

## 2018-07-13 ENCOUNTER — Encounter: Payer: Self-pay | Admitting: Family

## 2018-07-13 VITALS — BP 110/82 | HR 90 | Temp 98.0°F | Resp 16 | Ht 63.0 in | Wt 260.5 lb

## 2018-07-13 DIAGNOSIS — M7989 Other specified soft tissue disorders: Secondary | ICD-10-CM | POA: Diagnosis not present

## 2018-07-13 DIAGNOSIS — L509 Urticaria, unspecified: Secondary | ICD-10-CM

## 2018-07-13 DIAGNOSIS — Z6841 Body Mass Index (BMI) 40.0 and over, adult: Secondary | ICD-10-CM | POA: Diagnosis not present

## 2018-07-13 DIAGNOSIS — Z23 Encounter for immunization: Secondary | ICD-10-CM

## 2018-07-13 MED ORDER — TRIAMCINOLONE ACETONIDE 0.5 % EX OINT: 1.0000 "application " | TOPICAL_OINTMENT | Freq: Two times a day (BID) | CUTANEOUS | 0 refills | Status: AC

## 2018-07-13 MED ORDER — BUPROPION HCL ER (XL) 150 MG PO TB24: ORAL_TABLET | ORAL | 3 refills | Status: DC

## 2018-07-13 NOTE — Progress Notes (Signed)
Subjective:    Patient ID: [name], female    DOB: 1976/06/03, 42 y.o.   MRN: [id number]  CC: [name] is a 42 y.o. female who presents today for follow up.   HPI: Hives - No recent hives. No sob, lip swelling. Continues to carry  epi pen. Taking allerga QOD with symptom relief. Has not had to start zantac .   Leg swelling improved on hctz QOD.   Uses kenalog prn with vaseline. Would like refill  Frustrated by weight. Drinking slim fast for breakfast. Doesn't have motivation to exercise.   Also notes Trouble falling asleep. Watching tv in bed till 12/12:30. Gets up at [facility].   Has done phentermine in past however gained weight back.   H/o post partum depression and treated at that time. Doesn't recall medication. No si/hi.   No h/o seizure.   No heavy drinking. Will have  1-2 glasses of wine on weekend nights.      HISTORY:  No past medical history on file. Past Surgical History:  Procedure Laterality Date  . c-sections x 2    . COLONOSCOPY WITH PROPOFOL N/A 05/30/2017   Procedure: COLONOSCOPY WITH PROPOFOL;  Surgeon: Jonathon Bellows, MD;  Location: [facility] ENDOSCOPY;  Service: Gastroenterology;  Laterality: N/A;   Family History  Problem Relation Age of Onset  . Breast cancer Maternal Aunt   . Breast cancer Paternal Aunt     Allergies: Morphine Current Outpatient Medications on File Prior to Visit  Medication Sig Dispense Refill  . EPINEPHrine 0.3 mg/0.3 mL IJ SOAJ injection INJECT 0.3 MLS (0.3 MG TOTAL) INTO THE MUSCLE ONCE FOR 1 DOSE.  1  . fexofenadine (ALLEGRA) 180 MG tablet Take 1 tablet (180 mg total) by mouth 2 (two) times daily. 60 tablet 1  . hydrochlorothiazide (MICROZIDE) 12.5 MG capsule Take 1 capsule (12.5 mg total) by mouth daily. 90 capsule 0   No current facility-administered medications on file prior to visit.     Social History   Tobacco Use  . Smoking status: Light Tobacco Smoker  . Smokeless tobacco: Never Used  Substance Use Topics    . Alcohol use: Yes  . Drug use: No    Review of Systems  Constitutional: Negative for chills and fever.  Respiratory: Negative for cough.   Cardiovascular: Negative for chest pain, palpitations and leg swelling.  Gastrointestinal: Negative for nausea and vomiting.  Skin: Negative for rash.  Psychiatric/Behavioral: Positive for sleep disturbance. Negative for suicidal ideas. The patient is not nervous/anxious.       Objective:    BP 110/82 (BP Location: Left Arm, Patient Position: Sitting, Cuff Size: Large)   Pulse 90   Temp 98 F (36.7 C) (Oral)   Resp 16   Ht 5\' 3"  (1.6 m)   Wt 260 lb 8 oz (118.2 kg)   SpO2 99%   BMI 46.15 kg/m  BP Readings from Last 3 Encounters:  07/13/18 110/82  05/08/18 118/87  11/24/17 120/70   Wt Readings from Last 3 Encounters:  07/13/18 260 lb 8 oz (118.2 kg)  05/08/18 265 lb 2 oz (120.3 kg)  11/24/17 252 lb (114.3 kg)    Physical Exam  Constitutional: She appears well-developed and well-nourished.  Eyes: Conjunctivae are normal.  Cardiovascular: Normal rate, regular rhythm, normal heart sounds and normal pulses.  Pulmonary/Chest: Effort normal and breath sounds normal. She has no wheezes. She has no rhonchi. She has no rales.  Neurological: She is alert.  Skin: Skin is  warm and dry.  Psychiatric: She has a normal mood and affect. Her speech is normal and behavior is normal. Thought content normal.  Vitals reviewed.      Assessment & Plan:   Problem List Items Addressed This Visit      Musculoskeletal and Integument   Hives    Symptoms controlled at this time. Continue regimen      Relevant Medications   triamcinolone ointment (KENALOG) 0.5 %     Other   Leg swelling    Symptoms controlled at this time. Continue regimen      Class 3 severe obesity with body mass index (BMI) of 40.0 to 44.9 in adult [facility]) - Primary     trial of Wellbutrin.  Also discussed importance of lifestyle modifications including exercise to help with  weight loss, trouble falling asleep.  Close follow-up.      Relevant Medications   buPROPion (WELLBUTRIN XL) 150 MG 24 hr tablet    Other Visit Diagnoses    Need for immunization against influenza       Relevant Orders   Flu Vaccine QUAD 36+ mos IM (Completed)       I am having [name] start on buPROPion. I am also having her maintain her fexofenadine, hydrochlorothiazide, EPINEPHrine, and triamcinolone ointment.   Meds ordered this encounter  Medications  . triamcinolone ointment (KENALOG) 0.5 %    Sig: Apply 1 application topically 2 (two) times daily.    Dispense:  30 g    Refill:  0    Order Specific Question:   Supervising Provider    Answer:   Deborra Medina L [[id number]]  . buPROPion (WELLBUTRIN XL) 150 MG 24 hr tablet    Sig: Start 150 mg ER PO qam, increase after 3 days to 300 mg qam.    Dispense:  60 tablet    Refill:  3    Order Specific Question:   Supervising Provider    Answer:   Crecencio Mc [[id number]]    Return precautions given.   Risks, benefits, and alternatives of the medications and treatment plan prescribed today were discussed, and patient expressed understanding.   Education regarding symptom management and diagnosis given to patient on AVS.  Continue to follow with Burnard Hawthorne, FNP for routine health maintenance.   [name] and I agreed with plan.   Mable Paris, FNP

## 2018-07-13 NOTE — Patient Instructions (Addendum)
Start wellbutrin as discussed.   Please limit alcohol to no more than one glass of wine per sitting, since on wellbutrin AND this is good for weight loss :)   This is  Dr. Lupita Dawn  example of a  "Low GI"  Diet:  It will allow you to lose 4 to 8  lbs  per month if you follow it carefully.  Your goal with exercise is a minimum of 30 minutes of aerobic exercise 5 days per week (Walking does not count once it becomes easy!)    All of the foods can be found at grocery stores and in bulk at [employer].  The Atkins protein bars and shakes are available in more varieties at [employer], [employer] and [employer].     7 AM Breakfast:  Choose from the following:  Low carbohydrate Protein  Shakes (I recommend the  Premier Protein chocolate shakes,  EAS AdvantEdge "Carb Control" shakes  Or the Atkins shakes all are under 3 net carbs)     a scrambled egg/bacon/cheese burrito made with [employer]'s "carb balance" whole wheat tortilla  (about 10 net carbs )  Regulatory affairs officer (basically a quiche without the pastry crust) that is eaten cold and very convenient way to get your eggs.  8 carbs)  If you make your own protein shakes, avoid bananas and pineapple,  And use low carb greek yogurt or original /unsweetened almond or soy milk    Avoid cereal and bananas, oatmeal and cream of wheat and grits. They are loaded with carbohydrates!   10 AM: high protein snack:  Protein bar by Atkins (the snack size, under 200 cal, usually < 6 net carbs).    A stick of cheese:  Around 1 carb,  100 cal     Dannon Light n Fit [place] Yogurt  (80 cal, 8 carbs)  Other so called "protein bars" and Greek yogurts tend to be loaded with carbohydrates.  Remember, in food advertising, the word "energy" is synonymous for " carbohydrate."  Lunch:   A Sandwich using the bread choices listed, Can use any  Eggs,  lunchmeat, grilled meat or canned tuna), avocado, regular mayo/mustard  and cheese.  A Salad using blue cheese,  ranch,  Goddess or vinagrette,  Avoid taco shells, croutons or "confetti" and no "candied nuts" but regular nuts OK.   No pretzels, nabs  or chips.  Pickles and miniature sweet peppers are a good low carb alternative that provide a "crunch"  The bread is the only source of carbohydrate in a sandwich and  can be decreased by trying some of the attached alternatives to traditional loaf bread   Avoid "Low fat dressings, as well as Organ dressings They are loaded with sugar!   3 PM/ Mid day  Snack:  Consider  1 ounce of  almonds, walnuts, pistachios, pecans, peanuts,  Macadamia nuts or a nut medley.  Avoid "granola and granola bars "  Mixed nuts are ok in moderation as long as there are no raisins,  cranberries or dried fruit.   KIND bars are OK if you get the low glycemic index variety   Try the prosciutto/mozzarella cheese sticks by [employer]  In deli /backery section   High protein      6 PM  Dinner:     Meat/fowl/fish with a green salad, and either broccoli, cauliflower, green beans, spinach, brussel sprouts or  Lima beans. DO NOT BREAD THE PROTEIN!!  There is a low carb pasta by [employer] that is acceptable and tastes great: only 5 digestible carbs/serving.( All grocery stores but [employer] carry it ) Several ready made meals are available low carb:   Try Michel Angelo's chicken piccata or chicken or eggplant parm over low carb pasta.(Lowes and [employer])   Marjory Lies Sanchez's "Carnitas" (pulled pork, no sauce,  0 carbs) or his beef pot roast to make a dinner burrito (at [employer])  Pesto over low carb pasta (bj's sells a good quality pesto in the center refrigerated section of the deli   Try satueeing  Cheral Marker with mushroooms as a good side   [employer] makes a mashed cauliflower that tastes like mashed potatoes  Whole wheat pasta is still full of digestible carbs and  Not as low in glycemic index as Dreamfield's.   Brown rice is still rice,  So skip the rice and noodles if you  eat [place] or [place] (or at least limit to 1/2 cup)  9 PM snack :   Breyer's "low carb" fudgsicle or  ice cream bar (Carb Smart line), or  Weight Watcher's ice cream bar , or another "no sugar added" ice cream;  a serving of fresh berries/cherries with whipped cream   Cheese or DANNON'S LlGHT N FIT GREEK YOGURT  8 ounces of [place] unsweetened almond/cococunut milk    Treat yourself to a parfait made with whipped cream blueberiies, walnuts and vanilla greek yogurt  Avoid bananas, pineapple, grapes  and watermelon on a regular basis because they are high in sugar.  THINK OF THEM AS DESSERT  Remember that snack Substitutions should be less than 10 NET carbs per serving and meals < 20 carbs. Remember to subtract fiber grams to get the "net carbs."  @TULLOBREADPACKAGE @

## 2018-07-15 DIAGNOSIS — Z6841 Body Mass Index (BMI) 40.0 and over, adult: Secondary | ICD-10-CM

## 2018-07-15 NOTE — Assessment & Plan Note (Signed)
trial of Wellbutrin.  Also discussed importance of lifestyle modifications including exercise to help with weight loss, trouble falling asleep.  Close follow-up.

## 2018-07-15 NOTE — Assessment & Plan Note (Signed)
Symptoms controlled at this time. Continue regimen

## 2018-07-22 ENCOUNTER — Ambulatory Visit
Admission: RE | Admit: 2018-07-22 | Discharge: 2018-07-22 | Disposition: A | Discharge status: Home / Self Care | Payer: BLUE CROSS/BLUE SHIELD | Source: Ambulatory Visit | Attending: Family | Admitting: Family

## 2018-07-22 DIAGNOSIS — Z1239 Encounter for other screening for malignant neoplasm of breast: Secondary | ICD-10-CM

## 2018-08-04 ENCOUNTER — Other Ambulatory Visit: Payer: Self-pay | Admitting: Family

## 2018-08-04 DIAGNOSIS — Z6841 Body Mass Index (BMI) 40.0 and over, adult: Principal | ICD-10-CM

## 2018-08-30 ENCOUNTER — Other Ambulatory Visit: Payer: Self-pay | Admitting: Family

## 2018-08-30 DIAGNOSIS — M7989 Other specified soft tissue disorders: Secondary | ICD-10-CM

## 2018-10-14 ENCOUNTER — Ambulatory Visit: Payer: BLUE CROSS/BLUE SHIELD | Admitting: Family

## 2018-11-01 ENCOUNTER — Other Ambulatory Visit: Payer: Self-pay | Admitting: Family

## 2018-11-01 DIAGNOSIS — Z6841 Body Mass Index (BMI) 40.0 and over, adult: Principal | ICD-10-CM

## 2019-01-15 IMAGING — MG MM DIGITAL SCREENING BILAT W/ TOMO W/ CAD
8 of 12 series · 8 of 28 positions shown · non-contrast
Comparison: Previous exam(s).

CLINICAL DATA: Screening.

EXAM:
2D DIGITAL SCREENING BILATERAL MAMMOGRAM WITH CAD AND ADJUNCT TOMO

[L CC]
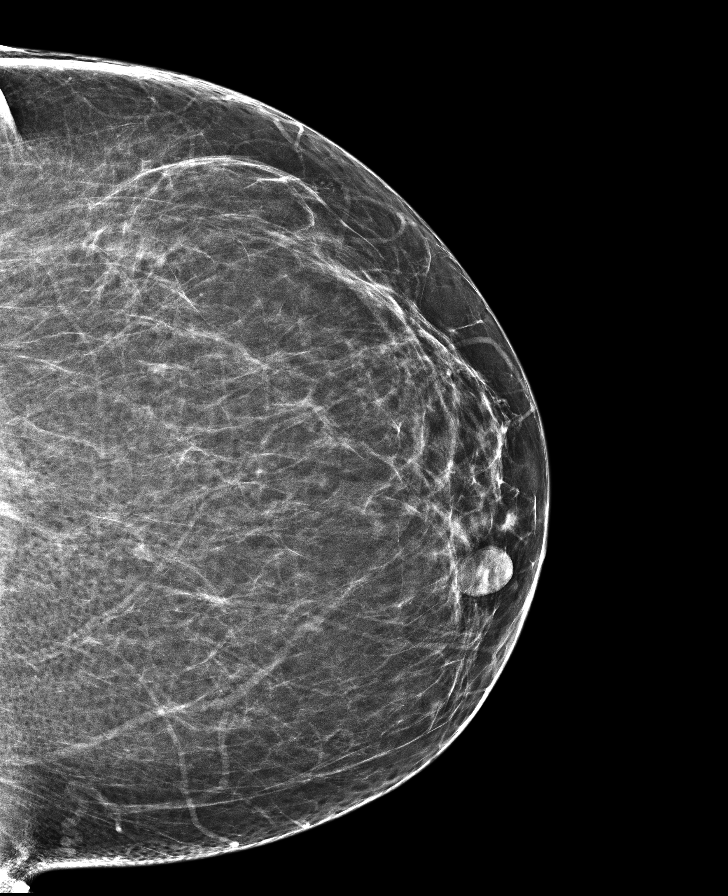

[R CC synth-2D]
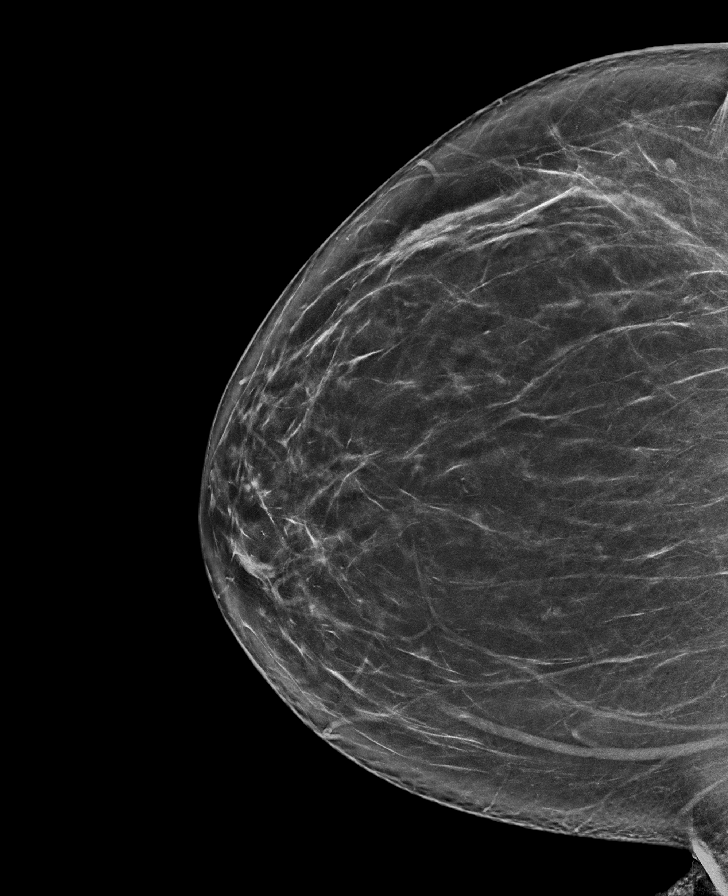

[R MLO synth-2D]
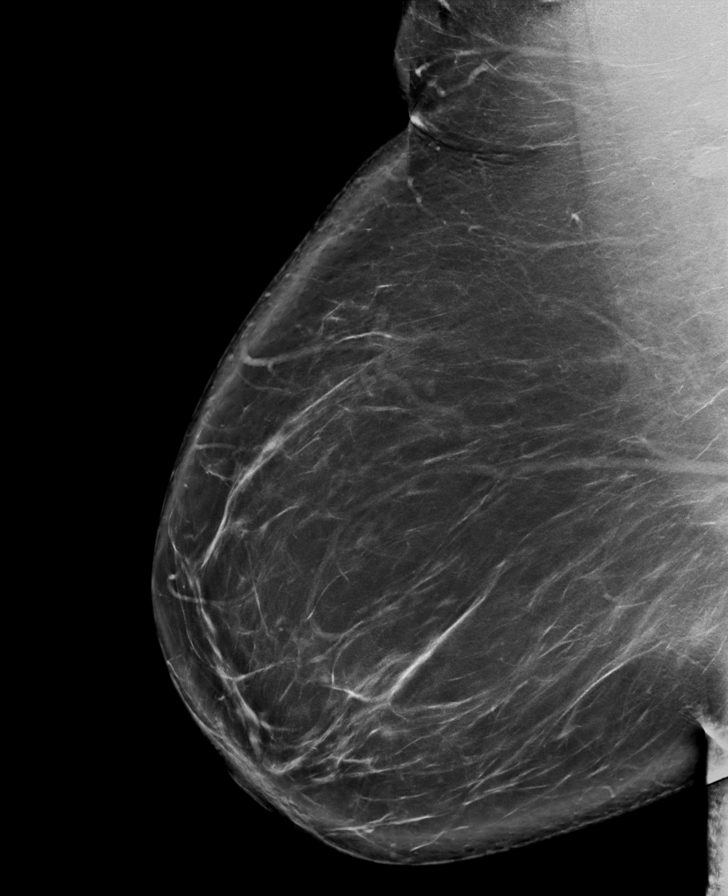

[R CC]
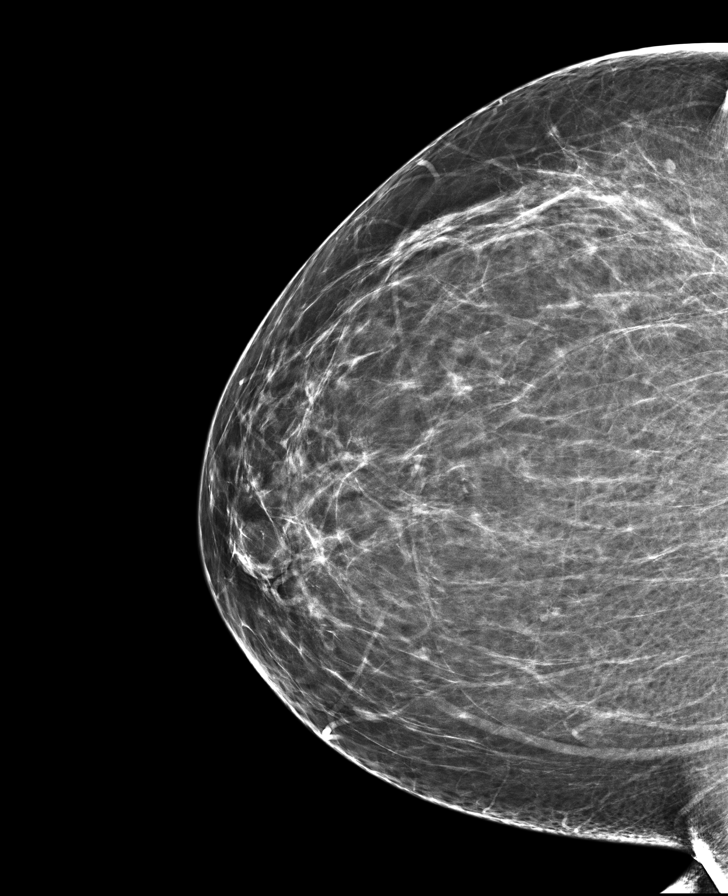

[L MLO synth-2D]
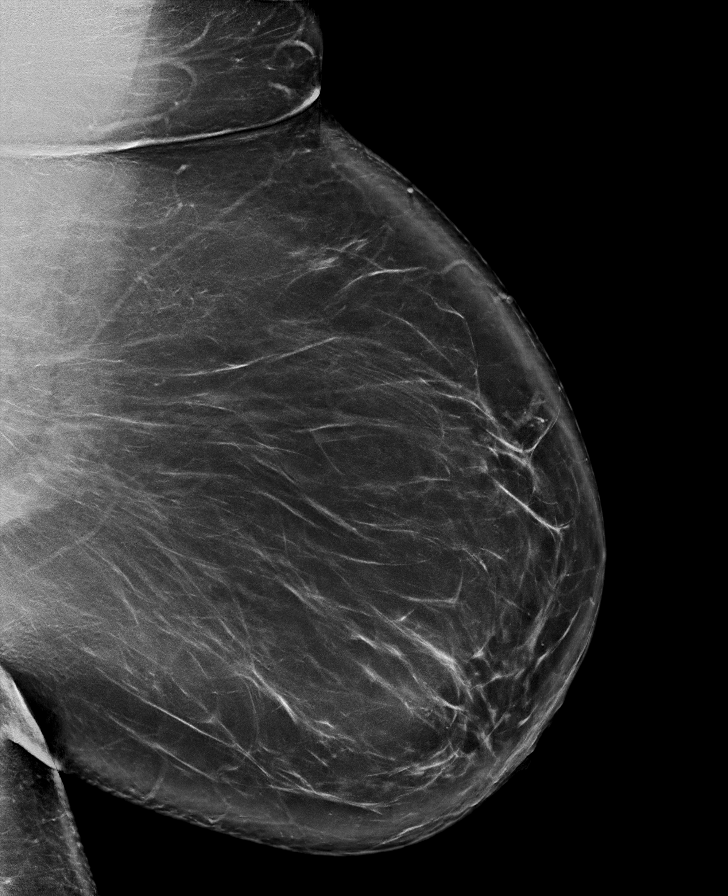

[L MLO]
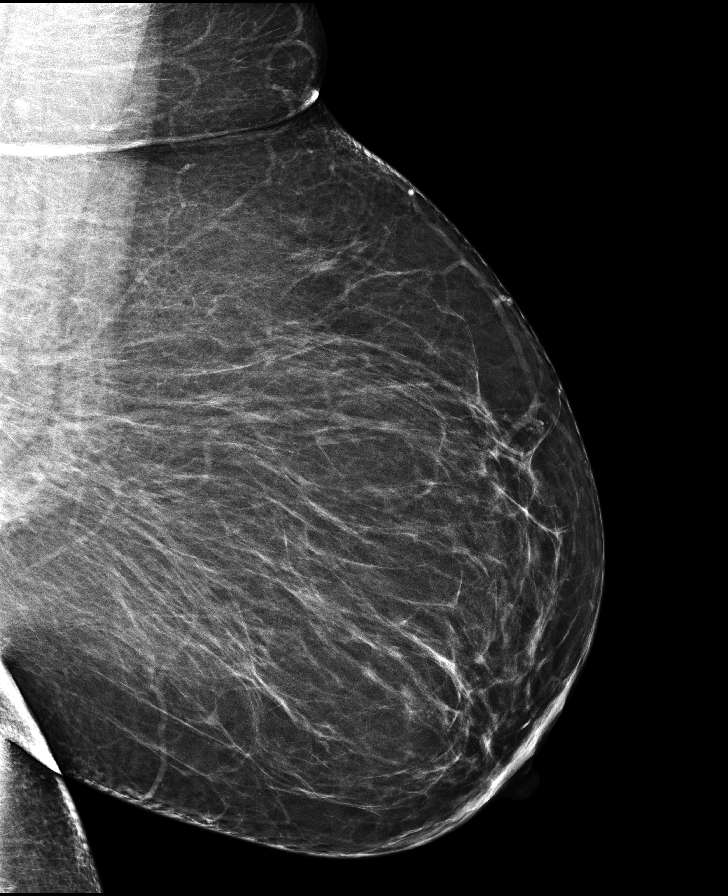

[R MLO]
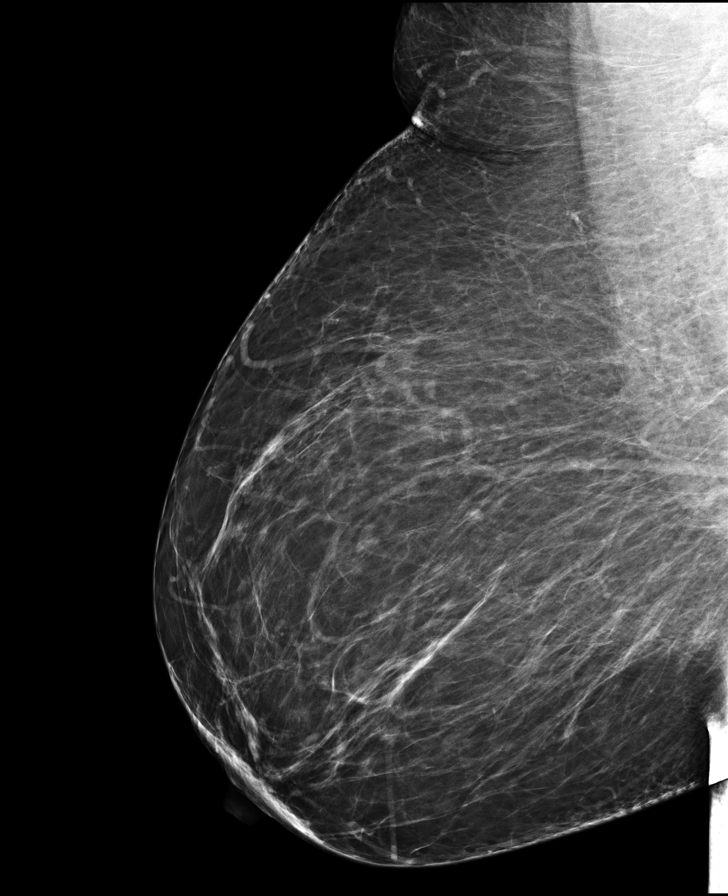

[L CC synth-2D]
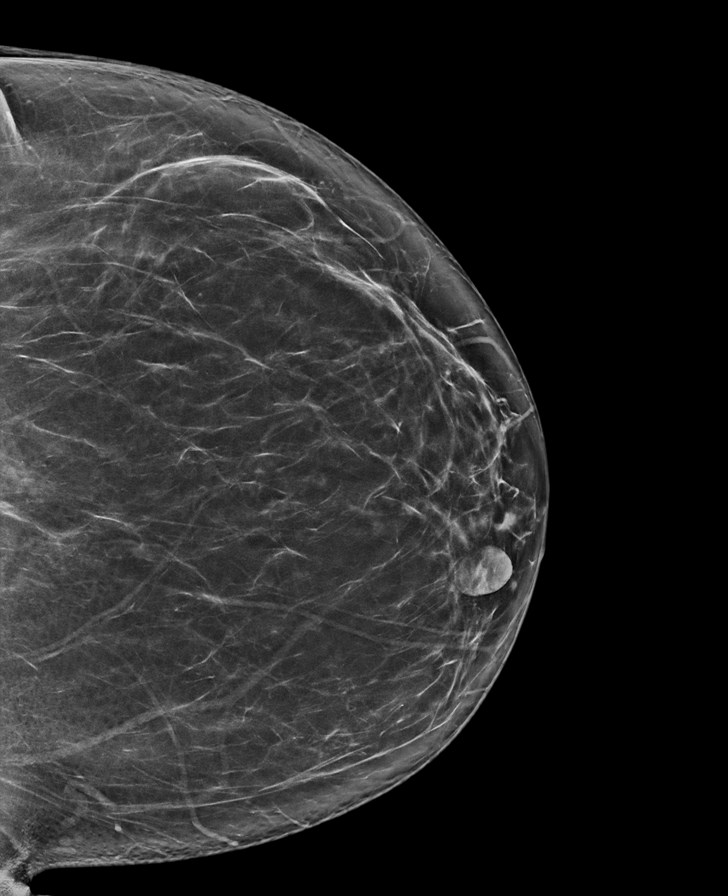

[8 of 28 positions shown; findings below may reference images not displayed]

ACR Breast Density Category b: There are scattered areas of
fibroglandular density.
FINDINGS: There are no findings suspicious for malignancy. Images were
processed with CAD.
IMPRESSION: No mammographic evidence of malignancy. A result letter of this
screening mammogram will be mailed directly to the patient.

RECOMMENDATION:
Screening mammogram in one year. (Code:[id number])

BI-RADS CATEGORY  1: Negative.

## 2019-06-08 ENCOUNTER — Other Ambulatory Visit: Payer: Self-pay | Admitting: Nurse Practitioner

## 2019-06-08 DIAGNOSIS — Z1231 Encounter for screening mammogram for malignant neoplasm of breast: Secondary | ICD-10-CM

## 2019-10-14 ENCOUNTER — Ambulatory Visit
Admission: RE | Admit: 2019-10-14 | Discharge: 2019-10-14 | Disposition: A | Discharge status: Home / Self Care | Payer: BLUE CROSS/BLUE SHIELD | Source: Ambulatory Visit | Attending: Nurse Practitioner | Admitting: Nurse Practitioner

## 2019-10-14 DIAGNOSIS — Z1231 Encounter for screening mammogram for malignant neoplasm of breast: Secondary | ICD-10-CM | POA: Diagnosis not present

## 2020-02-16 ENCOUNTER — Other Ambulatory Visit: Payer: Self-pay | Admitting: Family

## 2022-09-18 ENCOUNTER — Encounter: Payer: Self-pay | Admitting: Obstetrics and Gynecology
